# Patient Record
Sex: Female | Born: 1975 | Race: Black or African American | Hispanic: No | Marital: Single | Smoking: Current every day smoker
Health system: Southern US, Community
[De-identification: ages and names within clinical notes are randomized; demographics above are authoritative.]

## PROBLEM LIST (undated history)

## (undated) DIAGNOSIS — M5136 Other intervertebral disc degeneration, lumbar region: Secondary | ICD-10-CM

## (undated) DIAGNOSIS — M51369 Other intervertebral disc degeneration, lumbar region without mention of lumbar back pain or lower extremity pain: Secondary | ICD-10-CM

## (undated) DIAGNOSIS — IMO0002 Reserved for concepts with insufficient information to code with codable children: Secondary | ICD-10-CM

## (undated) DIAGNOSIS — I2699 Other pulmonary embolism without acute cor pulmonale: Secondary | ICD-10-CM

## (undated) DIAGNOSIS — I4891 Unspecified atrial fibrillation: Secondary | ICD-10-CM

## (undated) DIAGNOSIS — I82409 Acute embolism and thrombosis of unspecified deep veins of unspecified lower extremity: Secondary | ICD-10-CM

## (undated) DIAGNOSIS — M329 Systemic lupus erythematosus, unspecified: Secondary | ICD-10-CM

## (undated) DIAGNOSIS — G459 Transient cerebral ischemic attack, unspecified: Secondary | ICD-10-CM

## (undated) HISTORY — PX: LAPAROSCOPY: SHX197

## (undated) HISTORY — PX: GASTRIC BYPASS: SHX52

---

## 2015-01-06 ENCOUNTER — Encounter (HOSPITAL_COMMUNITY): Payer: Self-pay | Admitting: *Deleted

## 2015-01-06 ENCOUNTER — Emergency Department (HOSPITAL_COMMUNITY)
Admission: EM | Admit: 2015-01-06 | Discharge: 2015-01-06 | Disposition: A | Discharge status: Home / Self Care | Payer: Medicare Other | Attending: Emergency Medicine | Admitting: Emergency Medicine

## 2015-01-06 DIAGNOSIS — Z8679 Personal history of other diseases of the circulatory system: Secondary | ICD-10-CM | POA: Insufficient documentation

## 2015-01-06 DIAGNOSIS — M791 Myalgia, unspecified site: Secondary | ICD-10-CM

## 2015-01-06 DIAGNOSIS — R42 Dizziness and giddiness: Secondary | ICD-10-CM | POA: Diagnosis not present

## 2015-01-06 DIAGNOSIS — Z791 Long term (current) use of non-steroidal anti-inflammatories (NSAID): Secondary | ICD-10-CM | POA: Insufficient documentation

## 2015-01-06 DIAGNOSIS — Z79899 Other long term (current) drug therapy: Secondary | ICD-10-CM | POA: Diagnosis not present

## 2015-01-06 DIAGNOSIS — Z720 Tobacco use: Secondary | ICD-10-CM | POA: Diagnosis not present

## 2015-01-06 DIAGNOSIS — Z86718 Personal history of other venous thrombosis and embolism: Secondary | ICD-10-CM | POA: Diagnosis not present

## 2015-01-06 DIAGNOSIS — Z7982 Long term (current) use of aspirin: Secondary | ICD-10-CM | POA: Insufficient documentation

## 2015-01-06 DIAGNOSIS — M79606 Pain in leg, unspecified: Secondary | ICD-10-CM

## 2015-01-06 DIAGNOSIS — Z8673 Personal history of transient ischemic attack (TIA), and cerebral infarction without residual deficits: Secondary | ICD-10-CM | POA: Diagnosis not present

## 2015-01-06 DIAGNOSIS — Z86711 Personal history of pulmonary embolism: Secondary | ICD-10-CM | POA: Diagnosis not present

## 2015-01-06 DIAGNOSIS — Z87898 Personal history of other specified conditions: Secondary | ICD-10-CM

## 2015-01-06 HISTORY — DX: Other pulmonary embolism without acute cor pulmonale: I26.99

## 2015-01-06 HISTORY — DX: Reserved for concepts with insufficient information to code with codable children: IMO0002

## 2015-01-06 HISTORY — DX: Unspecified atrial fibrillation: I48.91

## 2015-01-06 HISTORY — DX: Acute embolism and thrombosis of unspecified deep veins of unspecified lower extremity: I82.409

## 2015-01-06 HISTORY — DX: Systemic lupus erythematosus, unspecified: M32.9

## 2015-01-06 HISTORY — DX: Transient cerebral ischemic attack, unspecified: G45.9

## 2015-01-06 LAB — BASIC METABOLIC PANEL
ANION GAP: 6 (ref 5–15)
BUN: 13 mg/dL (ref 6–23)
CO2: 29 mmol/L (ref 19–32)
Calcium: 9.1 mg/dL (ref 8.4–10.5)
Chloride: 106 mmol/L (ref 96–112)
Creatinine, Ser: 0.72 mg/dL (ref 0.50–1.10)
GFR calc Af Amer: >90 mL/min (ref >90)
Glucose, Bld: 77 mg/dL (ref 70–99)
Potassium: 3.7 mmol/L (ref 3.5–5.1)
Sodium: 141 mmol/L (ref 135–145)

## 2015-01-06 LAB — CBC WITH DIFFERENTIAL/PLATELET
Basophils Absolute: 0 10*3/uL (ref 0.0–0.1)
Basophils Relative: 1 % (ref 0–1)
Eosinophils Absolute: 0.1 10*3/uL (ref 0.0–0.7)
Eosinophils Relative: 2 % (ref 0–5)
HCT: 35 % — ABNORMAL LOW (ref 36.0–46.0)
Hemoglobin: 11.4 g/dL — ABNORMAL LOW (ref 12.0–15.0)
LYMPHS ABS: 1.1 10*3/uL (ref 0.7–4.0)
LYMPHS PCT: 33 % (ref 12–46)
MCH: 30.1 pg (ref 26.0–34.0)
MCHC: 32.6 g/dL (ref 30.0–36.0)
MCV: 92.3 fL (ref 78.0–100.0)
MONOS PCT: 12 % (ref 3–12)
Monocytes Absolute: 0.4 10*3/uL (ref 0.1–1.0)
Neutro Abs: 1.8 10*3/uL (ref 1.7–7.7)
Neutrophils Relative %: 52 % (ref 43–77)
Platelets: 275 10*3/uL (ref 150–400)
RBC: 3.79 MIL/uL — ABNORMAL LOW (ref 3.87–5.11)
RDW: 13.1 % (ref 11.5–15.5)
WBC: 3.4 10*3/uL — AB (ref 4.0–10.5)

## 2015-01-06 LAB — CK: CK TOTAL: 159 U/L (ref 7–177)

## 2015-01-06 MED ORDER — MECLIZINE HCL 25 MG PO TABS
12.5000 mg | ORAL_TABLET | Freq: Once | ORAL | Status: AC
  Administered 2015-01-06: 12.5 mg via ORAL
  Filled 2015-01-06: qty 1

## 2015-01-06 MED ORDER — CYCLOBENZAPRINE HCL 10 MG PO TABS: 10.0000 mg | ORAL_TABLET | Freq: Three times a day (TID) | ORAL | Status: DC | PRN

## 2015-01-06 MED ORDER — MORPHINE SULFATE 4 MG/ML IJ SOLN
4.0000 mg | Freq: Once | INTRAMUSCULAR | Status: AC
  Administered 2015-01-06: 4 mg via INTRAVENOUS
  Filled 2015-01-06: qty 1

## 2015-01-06 MED ORDER — OXYCODONE-ACETAMINOPHEN 5-325 MG PO TABS: 1.0000 | ORAL_TABLET | Freq: Four times a day (QID) | ORAL | Status: DC | PRN

## 2015-01-06 MED ORDER — NAPROXEN 500 MG PO TABS: 500.0000 mg | ORAL_TABLET | Freq: Two times a day (BID) | ORAL | Status: DC | PRN

## 2015-01-06 MED ORDER — SODIUM CHLORIDE 0.9 % IV BOLUS (SEPSIS)
1000.0000 mL | Freq: Once | INTRAVENOUS | Status: AC
  Administered 2015-01-06: 1000 mL via INTRAVENOUS

## 2015-01-06 NOTE — ED Notes (Signed)
Per EMS, pt complains of sharp left leg pain since yesterday. Pt has hx of lupus, a fib, PE, DVTs. Pt states her right leg also feels swollen.

## 2015-01-06 NOTE — Discharge Instructions (Signed)
Stay well hydrated. Take naprosyn as directed for inflammation and pain with percocet for breakthrough pain and flexeril for muscle relaxation. Do not drive or operate machinery with pain medication or muscle relaxation use. Apply heat to affected areas, no more than 20 minutes at a time every hour. Follow up with primary care physician for recheck of ongoing symptoms in 1 week or less. Return to ER for emergent changing or worsening of symptoms.     Musculoskeletal Pain Musculoskeletal pain is muscle and boney aches and pains. These pains can occur in any part of the body. Your caregiver may treat you without knowing the cause of the pain. They may treat you if blood or urine tests, X-rays, and other tests were normal.  CAUSES There is often not a definite cause or reason for these pains. These pains may be caused by a type of germ (virus). The discomfort may also come from overuse. Overuse includes working out too hard when your body is not fit. Boney aches also come from weather changes. Bone is sensitive to atmospheric pressure changes. HOME CARE INSTRUCTIONS   Ask when your test results will be ready. Make sure you get your test results.  Only take over-the-counter or prescription medicines for pain, discomfort, or fever as directed by your caregiver. If you were given medications for your condition, do not drive, operate machinery or power tools, or sign legal documents for 24 hours. Do not drink alcohol. Do not take sleeping pills or other medications that may interfere with treatment.  Continue all activities unless the activities cause more pain. When the pain lessens, slowly resume normal activities. Gradually increase the intensity and duration of the activities or exercise.  During periods of severe pain, bed rest may be helpful. Lay or sit in any position that is comfortable.  Putting ice on the injured area.  Put ice in a bag.  Place a towel between your skin and the bag.  Leave  the ice on for 15 to 20 minutes, 3 to 4 times a day.  Follow up with your caregiver for continued problems and no reason can be found for the pain. If the pain becomes worse or does not go away, it may be necessary to repeat tests or do additional testing. Your caregiver may need to look further for a possible cause. SEEK IMMEDIATE MEDICAL CARE IF:  You have pain that is getting worse and is not relieved by medications.  You develop chest pain that is associated with shortness or breath, sweating, feeling sick to your stomach (nauseous), or throw up (vomit).  Your pain becomes localized to the abdomen.  You develop any new symptoms that seem different or that concern you. MAKE SURE YOU:   Understand these instructions.  Will watch your condition.  Will get help right away if you are not doing well or get worse. Document Released: 10/16/2005 Document Revised: 01/08/2012 Document Reviewed: 06/20/2013 [facility] Patient Information 2015 [place], [place]. This information is not intended to replace advice given to you by your health care provider. Make sure you discuss any questions you have with your health care provider.  Heat Therapy Heat therapy can help ease sore, stiff, injured, and tight muscles and joints. Heat relaxes your muscles, which may help ease your pain.  RISKS AND COMPLICATIONS If you have any of the following conditions, do not use heat therapy unless your health care provider has approved:  Poor circulation.  Healing wounds or scarred skin in the area being treated.  Diabetes, heart disease, or high blood pressure.  Not being able to feel (numbness) the area being treated.  Unusual swelling of the area being treated.  Active infections.  Blood clots.  Cancer.  Inability to communicate pain. This may include young children and people who have problems with their brain function (dementia).  Pregnancy. Heat therapy should only be used on old, pre-existing, or  long-lasting (chronic) injuries. Do not use heat therapy on new injuries unless directed by your health care provider. HOW TO USE HEAT THERAPY There are several different kinds of heat therapy, including:  Moist heat pack.  Warm water bath.  Hot water bottle.  Electric heating pad.  Heated gel pack.  Heated wrap.  Electric heating pad. Use the heat therapy method suggested by your health care provider. Follow your health care provider's instructions on when and how to use heat therapy. GENERAL HEAT THERAPY RECOMMENDATIONS  Do not sleep while using heat therapy. Only use heat therapy while you are awake.  Your skin may turn pink while using heat therapy. Do not use heat therapy if your skin turns red.  Do not use heat therapy if you have new pain.  High heat or long exposure to heat can cause burns. Be careful when using heat therapy to avoid burning your skin.  Do not use heat therapy on areas of your skin that are already irritated, such as with a rash or sunburn. SEEK MEDICAL CARE IF:  You have blisters, redness, swelling, or numbness.  You have new pain.  Your pain is worse. MAKE SURE YOU:  Understand these instructions.  Will watch your condition.  Will get help right away if you are not doing well or get worse. Document Released: 01/08/2012 Document Revised: 03/02/2014 Document Reviewed: 12/09/2013 [facility] Patient Information 2015 [place], [place]. This information is not intended to replace advice given to you by your health care provider. Make sure you discuss any questions you have with your health care provider.

## 2015-01-06 NOTE — ED Notes (Signed)
Pt states she has a squeezing pain that starts in her thigh that radiates down to her foot.

## 2015-01-06 NOTE — ED Provider Notes (Signed)
CSN: [id number]     Arrival date & time 01/06/15  1129 History   First MD Initiated Contact with Patient 01/06/15 1304     Chief Complaint  Patient presents with  . Leg Pain     (Consider location/radiation/quality/duration/timing/severity/associated sxs/prior Treatment) HPI Comments: Caitlin Houston is a 39 y.o. female with a PMHx of lupus, afib, DVT/PE on xarelto, and TIA, with PSHx of gastric bypass, who presents to the ED with complaints of bilateral thigh pain that began gradually yesterday after recently being very active and on her feet for the last 1 week. She reports the pain is 9/10 squeezing and shooting pain, intermittent, located in the thigh radiating down to her feet, with no known aggravating factors, and no treatments tried prior to arrival. She states that she has had similar pain in the past when she "overdid it", and typically takes Percocet at home but she does not have this medication at this time due to running out. Additional symptoms include intermittent tingling in both legs, and subjective bilateral lower extremity swelling which is chronic and unchanged. She also reports that she has chronic dizziness for which she takes meclizine at home, but she did not take this today. She denies any fevers, chills, chest pain, shortness of breath, abdominal pain, nausea, vomiting, diarrhea, constipation, dysuria, hematuria, vaginal bleeding or discharge, melena, hematochezia, headache, vision changes, lightheadedness, numbness, or focal weakness. Denies any joint swelling, arthralgias, erythema, or warmth to bilateral lower extremities.  Patient is a 39 y.o. female presenting with leg pain. The history is provided by the patient. No language interpreter was used.  Leg Pain Location:  Leg Time since incident:  1 day Injury: no   Leg location:  L leg and R leg Pain details:    Quality: squeezing.   Radiates to:  Does not radiate   Severity:  Moderate   Onset quality:  Gradual    Duration:  1 day   Timing:  Intermittent   Progression:  Waxing and waning Chronicity:  Recurrent Prior injury to area:  No Relieved by:  None tried Worsened by:  Nothing tried Ineffective treatments:  None tried Associated symptoms: tingling (intermittently)   Associated symptoms: no back pain, no decreased ROM, no fever, no muscle weakness, no neck pain, no numbness, no stiffness and no swelling (subjective chronic unchanged BLE swelling)   Risk factors: obesity     Past Medical History  Diagnosis Date  . Lupus   . Pulmonary embolism   . DVT (deep venous thrombosis)   . Atrial fibrillation   . TIA (transient ischemic attack)    Past Surgical History  Procedure Laterality Date  . Gastric bypass     No family history on file. History  Substance Use Topics  . Smoking status: Current Every Day Smoker    Types: Cigarettes  . Smokeless tobacco: Not on file  . Alcohol Use: No   OB History    No data available     Review of Systems  Constitutional: Negative for fever and chills.  Eyes: Negative for visual disturbance.  Respiratory: Negative for shortness of breath.   Cardiovascular: Positive for leg swelling (subjective chronic BLE swelling, unchanged). Negative for chest pain and palpitations.  Gastrointestinal: Negative for nausea, vomiting, abdominal pain, diarrhea and constipation.  Genitourinary: Negative for dysuria, hematuria, vaginal bleeding and vaginal discharge.  Musculoskeletal: Positive for myalgias (BLEs). Negative for back pain, joint swelling, arthralgias, stiffness, neck pain and neck stiffness.  Skin: Negative for color change and  rash.  Allergic/Immunologic: Positive for immunocompromised state (on plaquenil).  Neurological: Positive for dizziness (chronic per pt, hasn't taken daily meclizine). Negative for seizures, syncope, facial asymmetry, weakness, light-headedness, numbness and headaches.  Hematological: Bruises/bleeds easily (on xarelto).   Psychiatric/Behavioral: Negative for confusion.   10 Systems reviewed and are negative for acute change except as noted in the HPI.    Allergies  Review of patient's allergies indicates no known allergies.  Home Medications   Prior to Admission medications   Medication Sig Start Date End Date Taking? Authorizing Provider  aspirin 81 MG tablet Take 81 mg by mouth 3 (three) times daily.   Yes Historical Provider, MD  butalbital-acetaminophen-caffeine (FIORICET, ESGIC) 50-325-40 MG per tablet Take 1 tablet by mouth as needed for headache or migraine.  12/02/14  Yes Historical Provider, MD  clotrimazole (LOTRIMIN) 1 % external solution Apply 1 application topically at bedtime. 11/09/14  Yes Historical Provider, MD  cyclobenzaprine (FLEXERIL) 10 MG tablet Take 1 tablet by mouth at bedtime as needed for muscle spasms.  11/15/14  Yes Historical Provider, MD  hydroxychloroquine (PLAQUENIL) 200 MG tablet Take 200 mg by mouth 3 (three) times daily.   Yes Historical Provider, MD  meclizine (ANTIVERT) 12.5 MG tablet Take 1 tablet by mouth 2 (two) times daily. 12/02/14  Yes Historical Provider, MD  naproxen (NAPROSYN) 500 MG tablet Take 2 tablets by mouth every 6 (six) hours as needed for moderate pain.  11/15/14  Yes Historical Provider, MD  rivaroxaban (XARELTO) 20 MG TABS tablet Take 20 mg by mouth daily.   Yes Historical Provider, MD   BP 121/75 mmHg  Pulse 70  Temp(Src) 98.6 F (37 C) (Oral)  Resp 18  SpO2 99%  LMP 01/04/2015 Physical Exam  Constitutional: She is oriented to person, place, and time. Vital signs are normal. She appears well-developed and well-nourished.  Non-toxic appearance. No distress.  Afebrile, nontoxic, NAD, resting comfortably  HENT:  Head: Normocephalic and atraumatic.  Mouth/Throat: Oropharynx is clear and moist and mucous membranes are normal.  Eyes: Conjunctivae and EOM are normal. Pupils are equal, round, and reactive to light. Right eye exhibits no discharge. Left  eye exhibits no discharge.  PERRL, EOMI, no nystagmus  Neck: Normal range of motion. Neck supple.  No meningismus  Cardiovascular: Normal rate, regular rhythm, normal heart sounds and intact distal pulses.  Exam reveals no gallop and no friction rub.   No murmur heard. RRR, nl s1/s2, no m/r/g, distal pulses intact, no pedal edema   Pulmonary/Chest: Effort normal and breath sounds normal. No respiratory distress. She has no decreased breath sounds. She has no wheezes. She has no rhonchi. She has no rales.  CTAB in all lung fields, no w/r/r, no hypoxia or increased WOB, speaking in full sentences, SpO2 99% on RA   Abdominal: Soft. Normal appearance and bowel sounds are normal. She exhibits no distension. There is no tenderness. There is no rigidity, no rebound, no guarding, no CVA tenderness, no tenderness at McBurney's point and negative Murphy's sign.  Musculoskeletal: Normal range of motion.       Right upper leg: She exhibits tenderness. She exhibits no bony tenderness, no swelling, no edema and no deformity.       Left upper leg: She exhibits tenderness. She exhibits no bony tenderness, no swelling, no edema and no deformity.       Right lower leg: She exhibits tenderness. She exhibits no bony tenderness, no swelling, no edema and no deformity.  Left lower leg: She exhibits tenderness. She exhibits no bony tenderness, no swelling, no edema and no deformity.  Diffuse b/l thigh and lower leg TTP, no focal bony TTP, no swelling or edema, no deformities. FROM intact at all major joints. Strength 5/5 in all extremities, sensation grossly intact in all extremities, distal pulses intact. Neg homan's sign, although pt has discomfort with any palpation in lower extremities. No joint swelling or erythema, no warmth or skin changes.  Neurological: She is alert and oriented to person, place, and time. She has normal strength. No cranial nerve deficit or sensory deficit. GCS eye subscore is 4. GCS verbal  subscore is 5. GCS motor subscore is 6.  CN 2-12 grossly intact A&O x4 GCS 15 Sensation and strength intact  Skin: Skin is warm, dry and intact. No bruising and no rash noted. No erythema.  Psychiatric: She has a normal mood and affect.  Nursing note and vitals reviewed.   ED Course  Procedures (including critical care time) Labs Review Labs Reviewed  CBC WITH DIFFERENTIAL/PLATELET - Abnormal; Notable for the following:    WBC 3.4 (*)    RBC 3.79 (*)    Hemoglobin 11.4 (*)    HCT 35.0 (*)    All other components within normal limits  BASIC METABOLIC PANEL  CK    Imaging Review No results found.   EKG Interpretation None      MDM   Final diagnoses:  Pain of lower extremity, unspecified laterality  Muscle pain  H/O dizziness    39 y.o. female here with bilateral leg pain 1 day after recently being very active and on her feet 1 week. She states this is happened before when she has been very active. Compliant on all medications including Xarelto, no pedal edema or Homans sign, no hypoxia or tachycardia, no CP/SOB complaints, no concern for DVT/PE causing her pain. Patient chronically dizzy, did not take meclizine today, will give this medication here. Nonfocal neuro exam, no neurologic symptoms aside from chronic dizziness, doubt TIA/CVA or acute intracranial etiology, doubt need for imaging. Extremities neurovascularly intact with soft compartments. Given her history of recent exertion and history of lupus, will obtain basic labs and CK level. Will give fluids. We'll also give small doses of morphine for pain. Patient takes Percocet at home but has run out. I believe it is reasonable to give her a refill. Will reassess shortly.  3:19 PM Pt resting, feeling improved. CBC w/ chronic anemia noted. BMP and CK WNL. Like just musculoskeletal pain from exertion. Pt requesting refill of naprosyn x30 day supply, also requesting refill of flexeril and percocet. Agreed to giving few  days of flexeril/percocet but discussed that chronic controlled substance prescriptions will need to be filled by her PCP. Will have her f/up with PCP in 1wk for recheck. Discussed good hydration, ice/heat, and avoiding overexertion. I explained the diagnosis and have given explicit precautions to return to the ER including for any other new or worsening symptoms. The patient understands and accepts the medical plan as it's been dictated and I have answered their questions. Discharge instructions concerning home care and prescriptions have been given. The patient is STABLE and is discharged to home in good condition.  BP 121/75 mmHg  Pulse 70  Temp(Src) 98.6 F (37 C) (Oral)  Resp 18  SpO2 99%  LMP 01/04/2015  Meds ordered this encounter  Medications  . meclizine (ANTIVERT) tablet 12.5 mg    Sig:   . sodium chloride 0.9 %  bolus 1,000 mL    Sig:   . morphine 4 MG/ML injection 4 mg    Sig:   . cyclobenzaprine (FLEXERIL) 10 MG tablet    Sig: Take 1 tablet (10 mg total) by mouth 3 (three) times daily as needed for muscle spasms.    Dispense:  15 tablet    Refill:  0    Order Specific Question:  Supervising Provider    Answer:  MILLER, BRIAN [[id number]]  . oxyCODONE-acetaminophen (PERCOCET) 5-325 MG per tablet    Sig: Take 1 tablet by mouth every 6 (six) hours as needed for severe pain.    Dispense:  12 tablet    Refill:  0    Order Specific Question:  Supervising Provider    Answer:  MILLER, BRIAN [[id number]]  . naproxen (NAPROSYN) 500 MG tablet    Sig: Take 1 tablet (500 mg total) by mouth 2 (two) times daily as needed for mild pain, moderate pain or headache (TAKE WITH MEALS.).    Dispense:  60 tablet    Refill:  0    Order Specific Question:  Supervising Provider    Answer:  Noemi Chapel [[id number]]     Eevee Borbon Camprubi-Soms, PA-C 01/06/15 [place], MD 01/06/15 1943

## 2015-03-22 ENCOUNTER — Emergency Department (HOSPITAL_COMMUNITY)
Admission: EM | Admit: 2015-03-22 | Discharge: 2015-03-22 | Disposition: A | Discharge status: Home / Self Care | Payer: Medicare Other | Attending: Emergency Medicine | Admitting: Emergency Medicine

## 2015-03-22 ENCOUNTER — Encounter (HOSPITAL_COMMUNITY): Payer: Self-pay

## 2015-03-22 DIAGNOSIS — Z86718 Personal history of other venous thrombosis and embolism: Secondary | ICD-10-CM | POA: Diagnosis not present

## 2015-03-22 DIAGNOSIS — Z8679 Personal history of other diseases of the circulatory system: Secondary | ICD-10-CM | POA: Diagnosis not present

## 2015-03-22 DIAGNOSIS — Z8739 Personal history of other diseases of the musculoskeletal system and connective tissue: Secondary | ICD-10-CM | POA: Insufficient documentation

## 2015-03-22 DIAGNOSIS — Z791 Long term (current) use of non-steroidal anti-inflammatories (NSAID): Secondary | ICD-10-CM | POA: Insufficient documentation

## 2015-03-22 DIAGNOSIS — Z86711 Personal history of pulmonary embolism: Secondary | ICD-10-CM | POA: Insufficient documentation

## 2015-03-22 DIAGNOSIS — Z7982 Long term (current) use of aspirin: Secondary | ICD-10-CM | POA: Diagnosis not present

## 2015-03-22 DIAGNOSIS — Z760 Encounter for issue of repeat prescription: Secondary | ICD-10-CM | POA: Insufficient documentation

## 2015-03-22 DIAGNOSIS — Z79899 Other long term (current) drug therapy: Secondary | ICD-10-CM | POA: Diagnosis not present

## 2015-03-22 DIAGNOSIS — Z8673 Personal history of transient ischemic attack (TIA), and cerebral infarction without residual deficits: Secondary | ICD-10-CM | POA: Diagnosis not present

## 2015-03-22 DIAGNOSIS — Z720 Tobacco use: Secondary | ICD-10-CM | POA: Insufficient documentation

## 2015-03-22 MED ORDER — RIVAROXABAN 20 MG PO TABS: 20.0000 mg | ORAL_TABLET | Freq: Every day | ORAL | Status: DC

## 2015-03-22 MED ORDER — HYDROXYCHLOROQUINE SULFATE 200 MG PO TABS: 200.0000 mg | ORAL_TABLET | Freq: Three times a day (TID) | ORAL | Status: DC

## 2015-03-22 MED ORDER — CYCLOBENZAPRINE HCL 10 MG PO TABS: 10.0000 mg | ORAL_TABLET | Freq: Three times a day (TID) | ORAL | Status: DC | PRN

## 2015-03-22 MED ORDER — BUTALBITAL-APAP-CAFFEINE 50-325-40 MG PO TABS: 1.0000 | ORAL_TABLET | Freq: Four times a day (QID) | ORAL | Status: DC | PRN

## 2015-03-22 MED ORDER — OXYCODONE-ACETAMINOPHEN 5-325 MG PO TABS: 1.0000 | ORAL_TABLET | Freq: Four times a day (QID) | ORAL | Status: DC | PRN

## 2015-03-22 NOTE — ED Notes (Signed)
Pt here for a medication refill on on her meds. She's from [place] and has all her insurance information transferred down here but she can't find any Dr to see her for atleast one to three months. One of the doctors told her to come to the ER to get medications until she can get an appointment.

## 2015-03-22 NOTE — ED Provider Notes (Signed)
CSN: [id number]     Arrival date & time 03/22/15  1957 History   None    This chart was scribed for non-physician practitioner, Junius Creamer, [place] working with Serita Grit, MD by Forrestine Him, ED Scribe. This patient was seen in room WTR6/WTR6 and the patient's care was started at 9:30 PM.   Chief Complaint  Patient presents with  . Medication Refill   The history is provided by the patient. No language interpreter was used.    HPI Comments: Caitlin Houston is a 39 y.o. female with a PMHx of lupus, DVT, TIA, PE, and A-Fib who presents to the Emergency Department here for a medication refill this evening. Pt states she is orginally from [place] and had all medical insurance information transferred to [facility]. However, pt states she is unable to establish with a care team for another 1-3 months. [name] states she was advised to come to the emergency department until she can be seen for her appointment. No known allergies to medications.  Appointment with [facility] scheduled in 6 months  Past Medical History  Diagnosis Date  . Lupus   . Pulmonary embolism   . DVT (deep venous thrombosis)   . Atrial fibrillation   . TIA (transient ischemic attack)    Past Surgical History  Procedure Laterality Date  . Gastric bypass     History reviewed. No pertinent family history. History  Substance Use Topics  . Smoking status: Current Every Day Smoker    Types: Cigarettes  . Smokeless tobacco: Not on file  . Alcohol Use: No   OB History    No data available     Review of Systems  Constitutional: Negative for fever and chills.  Respiratory: Negative for shortness of breath.   Cardiovascular: Negative for chest pain.  Musculoskeletal: Positive for arthralgias.  Skin: Negative for rash and wound.  Neurological: Negative for dizziness and headaches.      Allergies  Review of patient's allergies indicates no known allergies.  Home Medications   Prior to  Admission medications   Medication Sig Start Date End Date Taking? Authorizing Provider  aspirin 81 MG tablet Take 81 mg by mouth 3 (three) times daily.    Historical Provider, MD  butalbital-acetaminophen-caffeine (FIORICET, ESGIC) 50-325-40 MG per tablet Take 1 tablet by mouth every 6 (six) hours as needed for headache or migraine. 03/22/15   Junius Creamer, NP  clotrimazole (LOTRIMIN) 1 % external solution Apply 1 application topically at bedtime. 11/09/14   Historical Provider, MD  cyclobenzaprine (FLEXERIL) 10 MG tablet Take 1 tablet (10 mg total) by mouth 3 (three) times daily as needed for muscle spasms. 03/22/15   Junius Creamer, NP  hydroxychloroquine (PLAQUENIL) 200 MG tablet Take 1 tablet (200 mg total) by mouth 3 (three) times daily. 03/22/15   Junius Creamer, NP  meclizine (ANTIVERT) 12.5 MG tablet Take 1 tablet by mouth 2 (two) times daily. 12/02/14   Historical Provider, MD  naproxen (NAPROSYN) 500 MG tablet Take 2 tablets by mouth every 6 (six) hours as needed for moderate pain.  11/15/14   Historical Provider, MD  naproxen (NAPROSYN) 500 MG tablet Take 1 tablet (500 mg total) by mouth 2 (two) times daily as needed for mild pain, moderate pain or headache (TAKE WITH MEALS.). 01/06/15   Mercedes Camprubi-Soms, PA-C  oxyCODONE-acetaminophen (PERCOCET) 5-325 MG per tablet Take 1 tablet by mouth every 6 (six) hours as needed for severe pain. 03/22/15   Junius Creamer, NP  rivaroxaban (XARELTO) 20 MG TABS tablet Take 1 tablet (20 mg total) by mouth daily. 03/22/15   Junius Creamer, NP   Triage Vitals: BP 119/74 mmHg  Pulse 98  Temp(Src) 98.7 F (37.1 C) (Oral)  Resp 20  SpO2 98%   Physical Exam  Constitutional: She is oriented to person, place, and time. She appears well-developed and well-nourished.  HENT:  Head: Normocephalic.  Eyes: EOM are normal.  Neck: Normal range of motion.  Pulmonary/Chest: Effort normal.  Musculoskeletal: Normal range of motion.  Neurological: She is alert and oriented to  person, place, and time.  Psychiatric: She has a normal mood and affect.  Nursing note and vitals reviewed.   ED Course  Procedures (including critical care time)  DIAGNOSTIC STUDIES: Oxygen Saturation is 98% on RA, Normal by my interpretation.    COORDINATION OF CARE: 9:31 PM- Will provide a referral for pt to be seen at [facility]. Will provide pt with a 1 month supply of needed medications. Discussed treatment plan with pt at bedside and pt agreed to plan.     Labs Review Labs Reviewed - No data to display  Imaging Review No results found.   EKG Interpretation None     Patient has been referred to [facility].  Hopefully, this will reduce the gap while she is waiting for an appointment [facility], which will be in 3-6 months is been given a one-month prescription of her much-needed medications including furosemide, Flexeril, Plaquenil, Percocet.   MDM   Final diagnoses:  Encounter for medication refill    I personally performed the services described in this documentation, which was scribed in my presence. The recorded information has been reviewed and is accurate.   Junius Creamer, NP 03/22/15 2153  Junius Creamer, NP 03/23/15 2009  Serita Grit, MD 03/26/15 [phone]

## 2015-03-22 NOTE — Discharge Instructions (Signed)
Medication Refill, Emergency Department We have refilled your medication today as a courtesy to you. It is best for your medical care, however, to take care of getting refills done through your primary caregiver's office. They have your records and can do a better job of follow-up than we can in the emergency department. On maintenance medications, we often only prescribe enough medications to get you by until you are able to see your regular caregiver. This is a more expensive way to refill medications. In the future, please plan for refills so that you will not have to use the emergency department for this. Thank you for your help. Your help allows [place] to better take care of the daily emergencies that enter our department. Document Released: 02/02/2004 Document Revised: 01/08/2012 Document Reviewed: 01/23/2014 [facility] Patient Information 2015 [place], [place]. This information is not intended to replace advice given to you by your health care provider. Make sure you discuss any questions you have with your health care provider. Refill prescriptions in the emergency department are done on a very as needed basis You havebeen given referrals to make an appointment with primary care physician at [facility] This should bridge the gap while you're waiting for an appointment with the family practice center

## 2015-05-31 ENCOUNTER — Encounter (HOSPITAL_COMMUNITY): Payer: Self-pay | Admitting: Emergency Medicine

## 2015-05-31 ENCOUNTER — Emergency Department (HOSPITAL_COMMUNITY)
Admission: EM | Admit: 2015-05-31 | Discharge: 2015-05-31 | Disposition: A | Discharge status: Home / Self Care | Payer: Medicare Other | Attending: Emergency Medicine | Admitting: Emergency Medicine

## 2015-05-31 DIAGNOSIS — Z8739 Personal history of other diseases of the musculoskeletal system and connective tissue: Secondary | ICD-10-CM | POA: Diagnosis not present

## 2015-05-31 DIAGNOSIS — Z760 Encounter for issue of repeat prescription: Secondary | ICD-10-CM | POA: Diagnosis present

## 2015-05-31 DIAGNOSIS — Z86718 Personal history of other venous thrombosis and embolism: Secondary | ICD-10-CM | POA: Insufficient documentation

## 2015-05-31 DIAGNOSIS — Z9884 Bariatric surgery status: Secondary | ICD-10-CM | POA: Diagnosis not present

## 2015-05-31 DIAGNOSIS — Z79899 Other long term (current) drug therapy: Secondary | ICD-10-CM | POA: Insufficient documentation

## 2015-05-31 DIAGNOSIS — Z86711 Personal history of pulmonary embolism: Secondary | ICD-10-CM | POA: Diagnosis not present

## 2015-05-31 DIAGNOSIS — Z720 Tobacco use: Secondary | ICD-10-CM | POA: Insufficient documentation

## 2015-05-31 DIAGNOSIS — I4891 Unspecified atrial fibrillation: Secondary | ICD-10-CM | POA: Insufficient documentation

## 2015-05-31 DIAGNOSIS — Z7982 Long term (current) use of aspirin: Secondary | ICD-10-CM | POA: Insufficient documentation

## 2015-05-31 DIAGNOSIS — Z8673 Personal history of transient ischemic attack (TIA), and cerebral infarction without residual deficits: Secondary | ICD-10-CM | POA: Diagnosis not present

## 2015-05-31 DIAGNOSIS — Z7901 Long term (current) use of anticoagulants: Secondary | ICD-10-CM | POA: Diagnosis not present

## 2015-05-31 MED ORDER — HYDROXYCHLOROQUINE SULFATE 200 MG PO TABS: 200.0000 mg | ORAL_TABLET | Freq: Three times a day (TID) | ORAL | Status: AC

## 2015-05-31 MED ORDER — MECLIZINE HCL 12.5 MG PO TABS: 12.5000 mg | ORAL_TABLET | Freq: Two times a day (BID) | ORAL | Status: AC

## 2015-05-31 MED ORDER — ASPIRIN 81 MG PO TABS: 81.0000 mg | ORAL_TABLET | Freq: Three times a day (TID) | ORAL | Status: AC

## 2015-05-31 MED ORDER — RIVAROXABAN 20 MG PO TABS: 20.0000 mg | ORAL_TABLET | Freq: Every day | ORAL | Status: AC

## 2015-05-31 MED ORDER — CLOTRIMAZOLE 1 % EX SOLN: 1.0000 "application " | Freq: Every day | CUTANEOUS | Status: AC

## 2015-05-31 MED ORDER — BUTALBITAL-APAP-CAFFEINE 50-325-40 MG PO TABS: 1.0000 | ORAL_TABLET | Freq: Four times a day (QID) | ORAL | Status: AC | PRN

## 2015-05-31 MED ORDER — CYCLOBENZAPRINE HCL 10 MG PO TABS: 10.0000 mg | ORAL_TABLET | Freq: Three times a day (TID) | ORAL | Status: DC | PRN

## 2015-05-31 MED ORDER — NAPROXEN 500 MG PO TABS: 1000.0000 mg | ORAL_TABLET | Freq: Four times a day (QID) | ORAL | Status: DC | PRN

## 2015-05-31 NOTE — Progress Notes (Signed)
Pt with 3 [facility] ED visits. Cm contacted by ED PA/NP, Rob B. Pt in to get refill on medications for last few ED visits Cm reviewed epic and spoke with pt who states she has attempted to find a new pcp but the earliest appt was found in [place] at the end of august Pt confirms she has medicare and [place] medicaid and came to [employer] in February 2016 from [place]. Pt states she called lists of medicaid doctors for [place], [place],  and [place] but all with appts a month or more out. Pt states her pcp in [place] had set her up for a pain management referral but she relocated before the appt.  Cm discussed the differences between EDPs and pcps and the need for her to secure a pcp to assist with monthly medication refills. Pt voiced understanding  Pt unable to tell Cm who her appt is with and where the appt in August 2016 is located "states it is at home in a purple folder Cm provided pt with a list of [employer] and a 14 page list of medicare providers within zip code [phone] Pt began calling doctors from the medicaid list Pt states pending call back from [facility]orene Dy for an earlier appt vs going to [employer]. CM present when pt was informed she should receive a call back by tomorrow. CM emphasized the websites for www.medicare.gov and [place] medicaid plus  Pt happy and expressed appreciation of resources offered

## 2015-05-31 NOTE — ED Provider Notes (Signed)
CSN: [id number]     Arrival date & time 05/31/15  1215 History  This chart was scribed for non-physician practitioner, Montine Circle, PA-C, working with Malvin Johns, MD by Ladene Artist, ED Scribe. This patient was seen in room [place] and the patient's care was started at 12:46 PM.   Chief Complaint  Patient presents with  . Medication Refill   The history is provided by the patient. No language interpreter was used.   HPI Comments: Caitlin Houston is a 39 y.o. female, with a h/o lupus, multiple DVTs and PE, who presents to the Emergency Department for medication refill. Pt recently moved from [place] and is transitioning to a new PCP. She has an upcoming appointment at the end of the month but ran out of Flexeril, Meclizine, Xarelto and Percocet 2 weeks ago. She requests a refill for the medications listed at this time.   Past Medical History  Diagnosis Date  . Lupus   . Pulmonary embolism   . DVT (deep venous thrombosis)   . Atrial fibrillation   . TIA (transient ischemic attack)    Past Surgical History  Procedure Laterality Date  . Gastric bypass     No family history on file. History  Substance Use Topics  . Smoking status: Current Every Day Smoker    Types: Cigarettes  . Smokeless tobacco: Not on file  . Alcohol Use: No   OB History    No data available     Review of Systems  Constitutional: Negative for fever.   Allergies  Review of patient's allergies indicates no known allergies.  Home Medications   Prior to Admission medications   Medication Sig Start Date End Date Taking? Authorizing Provider  aspirin 81 MG tablet Take 81 mg by mouth 3 (three) times daily.    Historical Provider, MD  butalbital-acetaminophen-caffeine (FIORICET, ESGIC) 50-325-40 MG per tablet Take 1 tablet by mouth every 6 (six) hours as needed for headache or migraine. 03/22/15   Junius Creamer, NP  clotrimazole (LOTRIMIN) 1 % external solution Apply 1 application topically at bedtime.  11/09/14   Historical Provider, MD  cyclobenzaprine (FLEXERIL) 10 MG tablet Take 1 tablet (10 mg total) by mouth 3 (three) times daily as needed for muscle spasms. 03/22/15   Junius Creamer, NP  hydroxychloroquine (PLAQUENIL) 200 MG tablet Take 1 tablet (200 mg total) by mouth 3 (three) times daily. 03/22/15   Junius Creamer, NP  meclizine (ANTIVERT) 12.5 MG tablet Take 1 tablet by mouth 2 (two) times daily. 12/02/14   Historical Provider, MD  naproxen (NAPROSYN) 500 MG tablet Take 2 tablets by mouth every 6 (six) hours as needed for moderate pain.  11/15/14   Historical Provider, MD  naproxen (NAPROSYN) 500 MG tablet Take 1 tablet (500 mg total) by mouth 2 (two) times daily as needed for mild pain, moderate pain or headache (TAKE WITH MEALS.). 01/06/15   Mercedes Camprubi-Soms, PA-C  oxyCODONE-acetaminophen (PERCOCET) 5-325 MG per tablet Take 1 tablet by mouth every 6 (six) hours as needed for severe pain. 03/22/15   Junius Creamer, NP  rivaroxaban (XARELTO) 20 MG TABS tablet Take 1 tablet (20 mg total) by mouth daily. 03/22/15   Junius Creamer, NP   BP 142/84 mmHg  Pulse 80  Temp(Src) 99 F (37.2 C) (Oral)  Resp 19  SpO2 100% Physical Exam  Constitutional: She is oriented to person, place, and time. She appears well-developed and well-nourished. No distress.  HENT:  Head: Normocephalic and atraumatic.  Eyes: Conjunctivae and  EOM are normal.  Neck: Neck supple. No tracheal deviation present.  Cardiovascular: Normal rate.   Pulmonary/Chest: Effort normal. No respiratory distress.  Musculoskeletal: Normal range of motion.  Neurological: She is alert and oriented to person, place, and time.  Skin: Skin is warm and dry.  Psychiatric: She has a normal mood and affect. Her behavior is normal.  Nursing note and vitals reviewed.  ED Course  Procedures (including critical care time) DIAGNOSTIC STUDIES: Oxygen Saturation is 100% on RA, normal by my interpretation.    COORDINATION OF CARE: 12:50 PM-Discussed  treatment plan which includes follow-up with PCP with pt at bedside and pt agreed to plan.   Labs Review Labs Reviewed - No data to display  Imaging Review No results found.   EKG Interpretation None      MDM   Final diagnoses:  None    Patient has no complaints, just needs her prescriptions refilled. Will refill everything but Percocet.Recommend close follow-up. Spoke with case management about the patient, who will see the patient while she is in the ED.  I personally performed the services described in this documentation, which was scribed in my presence. The recorded information has been reviewed and is accurate.      Montine Circle, PA-C 05/31/15 [place], MD 05/31/15 [phone]

## 2015-05-31 NOTE — ED Notes (Addendum)
Pt states that she was seen here while back and got her medications filled. Pt states that she is out and starting having pain last night.  Wanting oxy, xeralto, flexeril, and meclizine and something else.

## 2015-05-31 NOTE — Discharge Instructions (Signed)
Medication Refill, Emergency Department °We have refilled your medication today as a courtesy to you. It is best for your medical care, however, to take care of getting refills done through your primary caregiver's office. They have your records and can do a better job of follow-up than we can in the emergency department. °On maintenance medications, we often only prescribe enough medications to get you by until you are able to see your regular caregiver. This is a more expensive way to refill medications. °In the future, please plan for refills so that you will not have to use the emergency department for this. °Thank you for your help. Your help allows us to better take care of the daily emergencies that enter our department. °Document Released: 02/02/2004 Document Revised: 01/08/2012 Document Reviewed: 01/23/2014 °[employer]® Patient Information ©2015 [employer]. This information is not intended to replace advice given to you by your health care provider. Make sure you discuss any questions you have with your health care provider. ° °

## 2015-06-05 ENCOUNTER — Encounter (HOSPITAL_COMMUNITY): Payer: Self-pay | Admitting: *Deleted

## 2015-06-05 ENCOUNTER — Emergency Department (HOSPITAL_COMMUNITY)
Admission: EM | Admit: 2015-06-05 | Discharge: 2015-06-05 | Disposition: A | Discharge status: Home / Self Care | Payer: Medicare Other | Attending: Emergency Medicine | Admitting: Emergency Medicine

## 2015-06-05 DIAGNOSIS — Z7982 Long term (current) use of aspirin: Secondary | ICD-10-CM | POA: Diagnosis not present

## 2015-06-05 DIAGNOSIS — M5416 Radiculopathy, lumbar region: Secondary | ICD-10-CM | POA: Diagnosis not present

## 2015-06-05 DIAGNOSIS — I4891 Unspecified atrial fibrillation: Secondary | ICD-10-CM | POA: Diagnosis not present

## 2015-06-05 DIAGNOSIS — Z8739 Personal history of other diseases of the musculoskeletal system and connective tissue: Secondary | ICD-10-CM | POA: Insufficient documentation

## 2015-06-05 DIAGNOSIS — G8929 Other chronic pain: Secondary | ICD-10-CM | POA: Diagnosis not present

## 2015-06-05 DIAGNOSIS — Z8673 Personal history of transient ischemic attack (TIA), and cerebral infarction without residual deficits: Secondary | ICD-10-CM | POA: Insufficient documentation

## 2015-06-05 DIAGNOSIS — Z86711 Personal history of pulmonary embolism: Secondary | ICD-10-CM | POA: Insufficient documentation

## 2015-06-05 DIAGNOSIS — Z79899 Other long term (current) drug therapy: Secondary | ICD-10-CM | POA: Insufficient documentation

## 2015-06-05 DIAGNOSIS — Z8639 Personal history of other endocrine, nutritional and metabolic disease: Secondary | ICD-10-CM | POA: Insufficient documentation

## 2015-06-05 DIAGNOSIS — Z86718 Personal history of other venous thrombosis and embolism: Secondary | ICD-10-CM | POA: Insufficient documentation

## 2015-06-05 DIAGNOSIS — R21 Rash and other nonspecific skin eruption: Secondary | ICD-10-CM | POA: Insufficient documentation

## 2015-06-05 DIAGNOSIS — Z7901 Long term (current) use of anticoagulants: Secondary | ICD-10-CM | POA: Insufficient documentation

## 2015-06-05 DIAGNOSIS — Z9884 Bariatric surgery status: Secondary | ICD-10-CM | POA: Insufficient documentation

## 2015-06-05 DIAGNOSIS — Z720 Tobacco use: Secondary | ICD-10-CM | POA: Insufficient documentation

## 2015-06-05 DIAGNOSIS — M545 Low back pain: Secondary | ICD-10-CM | POA: Diagnosis present

## 2015-06-05 MED ORDER — OXYCODONE-ACETAMINOPHEN 5-325 MG PO TABS: 1.0000 | ORAL_TABLET | ORAL | Status: DC | PRN

## 2015-06-05 MED ORDER — OXYCODONE-ACETAMINOPHEN 5-325 MG PO TABS
2.0000 | ORAL_TABLET | Freq: Once | ORAL | Status: AC
  Administered 2015-06-05: 2 via ORAL
  Filled 2015-06-05: qty 2

## 2015-06-05 NOTE — ED Provider Notes (Signed)
CSN: [id number]     Arrival date & time 06/05/15  1919 History  This chart was scribed for non-physician practitioner Domenic Moras, PA-C, working with Wandra Arthurs, MD, by Thea Alken, ED Scribe. This patient was seen in room WTR7/WTR7 and the patient's care was started at 8:08 PM.  Chief Complaint  Patient presents with  . Hip Pain   The history is provided by the patient. No language interpreter was used.    Caitlin Houston is a 39 y.o. female who presents to the Emergency Department complaining of hip pain. Pt states she was being treated in [place] for hx of 3 blood clots in right leg and lupus which she was diagnosed with at age 23.  Pt was seen here 5 days ago for a medication refill. Since last visit she has had gradually worsening right low back pain that radiates down to right hip and right leg. 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 She has pain with certain movement,y walking up and down stair and bearing weight to right leg. She took flexeril, which she was prescribe 5 days ago, last night without relief to the pain and reports waking up today in excruciating pain .  No fever, chills, bowel/bladder incontinence.  Has chronic lupus rash, no new rash.  No new injuries.  Currently on Xarelto for her DVT.    Past Medical History  Diagnosis Date  . Lupus   . Pulmonary embolism   . DVT (deep venous thrombosis)   . Atrial fibrillation   . TIA (transient ischemic attack)    Past Surgical History  Procedure Laterality Date  . Gastric bypass     No family history on file. History  Substance Use Topics  . Smoking status: Current Every Day Smoker    Types: Cigarettes  . Smokeless tobacco: Not on file  . Alcohol Use: No   OB History    No data available     Review of Systems  Musculoskeletal: Positive for myalgias, back pain, arthralgias and gait problem.  Skin: Positive for rash.   Allergies  Review of patient's allergies indicates no  known allergies.  Home Medications   Prior to Admission medications   Medication Sig Start Date End Date Taking? Authorizing Provider  aspirin 81 MG tablet Take 1 tablet (81 mg total) by mouth 3 (three) times daily. 05/31/15   Montine Circle, PA-C  butalbital-acetaminophen-caffeine (FIORICET, ESGIC) 250 188 9706 MG per tablet Take 1 tablet by mouth every 6 (six) hours as needed for headache or migraine. 05/31/15   Montine Circle, PA-C  clotrimazole (LOTRIMIN) 1 % external solution Apply 1 application topically at bedtime. 05/31/15   Montine Circle, PA-C  cyclobenzaprine (FLEXERIL) 10 MG tablet Take 1 tablet (10 mg total) by mouth 3 (three) times daily as needed for muscle spasms. 05/31/15   Montine Circle, PA-C  hydroxychloroquine (PLAQUENIL) 200 MG tablet Take 1 tablet (200 mg total) by mouth 3 (three) times daily. 05/31/15   Montine Circle, PA-C  meclizine (ANTIVERT) 12.5 MG tablet Take 1 tablet (12.5 mg total) by mouth 2 (two) times daily. 05/31/15   Montine Circle, PA-C  naproxen (NAPROSYN) 500 MG tablet Take 2 tablets (1,000 mg total) by mouth every 6 (six) hours as needed for moderate pain. 05/31/15   Montine Circle, PA-C  oxyCODONE-acetaminophen (PERCOCET) 5-325 MG per tablet Take 1 tablet by mouth every 6 (six) hours as needed for severe pain. 03/22/15   Junius Creamer,  NP  rivaroxaban (XARELTO) 20 MG TABS tablet Take 1 tablet (20 mg total) by mouth daily. 05/31/15   Montine Circle, PA-C   BP 120/83 mmHg  Pulse 76  Temp(Src) 97.8 F (36.6 C) (Oral)  Resp 20  SpO2 97% Physical Exam  Constitutional: She is oriented to person, place, and time. She appears well-developed and well-nourished. No distress.  Morbidly obese AAF, nontoxic.  HENT:  Head: Normocephalic and atraumatic.  Eyes: Conjunctivae and EOM are normal.  Neck: Neck supple.  Cardiovascular: Normal rate and intact distal pulses.   Pulmonary/Chest: Effort normal.  Musculoskeletal: Normal range of motion.  Right para spinal muscle TTP  and right sacral muscle. Able to ambulate with assistance favoring left leg.  Neurological: She is alert and oriented to person, place, and time.  Intact Patella DTR bilat.  No foot drops  Skin: Skin is warm and dry.  Psychiatric: She has a normal mood and affect. Her behavior is normal.  Nursing note and vitals reviewed.   ED Course  Procedures (including critical care time) DIAGNOSTIC STUDIES: Oxygen Saturation is 97% on RA, normal by my interpretation.    COORDINATION OF CARE: 8:21 PM- Pt advised of plan for treatment and pt agrees.  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 No new red flags. She was seen in ER previously for same. She has an appointment with her PCP in 2 days . Pt made aware we do not give narcotic prescription for chronic pain.  However I will provide a short course of pain medication to help to Monday. She is aware that she will not receive additional narcotic prescription from the ER for chronic pain.  Labs Review Labs Reviewed - No data to display  Imaging Review No results found.   EKG Interpretation None      MDM   Final diagnoses:  Chronic radicular pain of lower back    BP 120/83 mmHg  Pulse 76  Temp(Src) 97.8 F (36.6 C) (Oral)  Resp 20  SpO2 97%   I personally performed the services described in this documentation, which was scribed in my presence. The recorded information has been reviewed and is accurate.     Domenic Moras, PA-C 06/05/15 2031  Wandra Arthurs, MD 06/05/15 [phone]

## 2015-06-05 NOTE — Discharge Instructions (Signed)
Please follow up promptly with your PCP for further management of your condition.   Chronic Back Pain  When back pain lasts longer than 3 months, it is called chronic back pain.People with chronic back pain often go through certain periods that are more intense (flare-ups).  CAUSES Chronic back pain can be caused by wear and tear (degeneration) on different structures in your back. These structures include:  The bones of your spine (vertebrae) and the joints surrounding your spinal cord and nerve roots (facets).  The strong, fibrous tissues that connect your vertebrae (ligaments). Degeneration of these structures may result in pressure on your nerves. This can lead to constant pain. HOME CARE INSTRUCTIONS  Avoid bending, heavy lifting, prolonged sitting, and activities which make the problem worse.  Take brief periods of rest throughout the day to reduce your pain. Lying down or standing usually is better than sitting while you are resting.  Take over-the-counter or prescription medicines only as directed by your caregiver. SEEK IMMEDIATE MEDICAL CARE IF:   You have weakness or numbness in one of your legs or feet.  You have trouble controlling your bladder or bowels.  You have nausea, vomiting, abdominal pain, shortness of breath, or fainting. Document Released: 11/23/2004 Document Revised: 01/08/2012 Document Reviewed: 09/30/2011 [facility] Patient Information 2015 [place], [place]. This information is not intended to replace advice given to you by your health care provider. Make sure you discuss any questions you have with your health care provider.   Emergency Department Resource Guide 1) Find a Doctor and Pay Out of Pocket Although you won't have to find out who is covered by your insurance plan, it is a good idea to ask around and get recommendations. You will then need to call the office and see if the doctor you have chosen will accept you as a new patient and what types of  options they offer for patients who are self-pay. Some doctors offer discounts or will set up payment plans for their patients who do not have insurance, but you will need to ask so you aren't surprised when you get to your appointment.  2) Contact Your Local Health Department Not all health departments have doctors that can see patients for sick visits, but many do, so it is worth a call to see if yours does. If you don't know where your local health department is, you can check in your phone book. The CDC also has a tool to help you locate your state's health department, and many state websites also have listings of all of their local health departments.  3) Find a [facility] If your illness is not likely to be very severe or complicated, you may want to try a walk in clinic. These are popping up all over the country in pharmacies, drugstores, and shopping centers. They're usually staffed by nurse practitioners or physician assistants that have been trained to treat common illnesses and complaints. They're usually fairly quick and inexpensive. However, if you have serious medical issues or chronic medical problems, these are probably not your best option.  No Primary Care Doctor: - Call Health Connect at  [phone] - they can help you locate a primary care doctor that  accepts your insurance, provides certain services, etc. - Physician Referral Service- [phone]  Chronic Pain Problems: Organization         Address  Phone   Notes  [facility]  [phone] Patients need to be referred by their primary care doctor.  Medication Assistance: Organization         Address  Phone   Notes  [facility] Medication [facility] [address] [place], [place] [address] [phone] --Must be a resident of [facility] -- Must have NO insurance coverage whatsoever (no Medicaid/ Medicare, etc.) -- The pt. MUST have a primary care doctor that directs  their care regularly and follows them in the community   MedAssist  [phone]   [employer]  [phone]    Agencies that provide inexpensive medical care: Organization         Address  Phone   Notes  [place]  [phone]   [facility]    [phone]   [facility] [place], [place] [address] [phone]   [place] [address], [place] [phone]   Planned Parenthood    ([phone]   [facility]    [phone]   [place] and [address], [place] Phone:  [phone], Fax:  [phone] Hours of Operation:  9 am - 6 pm, M-F.  Also accepts Medicaid/Medicare and self-pay.  [facility] for [place], [address], [place] Phone: [phone], Fax: [phone]. Hours of Operation:  8:30 am - 5:30 pm, M-F.  Also accepts Medicaid and self-pay.  [facility] [address], [place] Phone: [phone]   [place], [place], [place] [phone], Ext. [phone] Mondays & Thursdays: 7-9 AM.  First 15 patients are seen on a first come, first serve basis.    [place] Providers:  Organization         Address  Phone   Notes  [facility] [address], [place] [phone] Also accepts self-pay patients.  [facility] [address], [place]  [phone]   [place], [address], [place] [phone]   [facility] Family Medicine [address], [place] [phone]   Lucianne Lei [address], [place]   [phone] Only accepts [place] Access [place] patients after they have their name applied to their card.   Self-Pay (no insurance) in [facility]:  Organization          Address  Phone   Notes  Sickle Cell Patients, [facility] Internal Medicine [place] [phone]   [facility] [place] [phone]   [facility] [place]  [place], [place],  [phone]   [facility]/Dr. Osei-Bonsu  [address] [place] or [address], [place] [phone] Phone number for both [place] and [place] locations is the same.  Urgent Medical and [facility] [address], [place] [phone]   [facility] [address], [place] or [address] [phone]   [facility] [address], [place] [phone], phone; [phone], fax Sees patients 1st and 3rd Saturday of every month.  Must not qualify for public or private insurance (i.e. Medicaid, Medicare,  Health Choice, Veterans' Benefits)  Household income should be no more than 200% of the poverty level The  clinic cannot treat you if you are pregnant or think you are pregnant  Sexually transmitted diseases are not treated at the clinic.    Dental Care: Organization         Address  Phone  Notes  [facility] Department of [facility] [place] [phone] Accepts children up to age 41 who are enrolled in [place] or [place]; pregnant women with a Medicaid card; and children who have applied for Medicaid or [place] Health Choice, but were declined, whose parents can pay a reduced fee at time of service.  [facility] Department of [facility]  [address], [place] [phone] Accepts children up to age 68 who are enrolled in [place] or [place]; pregnant women with a Medicaid card; and children who have applied for Medicaid or [place] Health Choice, but were declined, whose parents can pay a reduced fee at time of  service.  [place] Adult Dental Access PROGRAM  [place] [phone] Patients are seen by appointment only. Walk-ins are not accepted. [place] will see patients 73 years of age and older. Monday - Tuesday (8am-5pm) Most Wednesdays (8:30-5pm) $30 per visit, cash only  [facility] Adult Dental Access PROGRAM  [address], [facility] [phone] Patients are seen by appointment only. Walk-ins are not accepted. [place] will see patients 82 years of age and older. One Wednesday Evening (Monthly: Volunteer Based).  $30 per visit, cash only  [place]  [phone] for adults; Children under age 81, call Graduate Pediatric Dentistry at [phone]. Children aged 41-14, please call [phone] to request a pediatric application.  Dental services are provided in all areas of dental care including fillings, crowns and bridges, complete and partial dentures, implants, gum treatment, root canals, and extractions. Preventive care is also provided. Treatment is provided to both adults and children. Patients are selected via a lottery and there is often a waiting list.   [facility] [address], [place]  [phone] [email or web address]   [facility] [address] [place], [place] [phone], Ext. [phone] Second and Fourth Thursday of each month, opens at 6:30 AM; Clinic ends at 9 AM.  Patients are seen on a first-come first-served basis, and a limited number are seen during each clinic.   [facility]  [address] Hillard Danker [place], [place] [phone]   Eligibility Requirements You must have lived in [place], [place], or [place] counties for at least the last three months.   You cannot be eligible for state or federal sponsored [employer], including [employer], [place], or [employer].   You generally cannot be eligible for healthcare insurance through your employer.     How to apply: Eligibility screenings are held every Tuesday and Wednesday afternoon from 1:00 pm until 4:00 pm. You do not need an appointment for the interview!  [facility] [address], [place], [place]   [place]  [place] Department  [place]  [phone]    Behavioral Health Resources in the Community: Intensive Outpatient Programs Organization         Address  Phone  Notes  [place]. [address] [place], [place] [phone]   [facility] Outpatient [address], [place],  [place] [phone]   ADS: Alcohol & Drug Svcs [address] [place], [place]   [place] [address]  [place], [place] or [phone]   Substance Abuse Resources Organization         Address  Phone  Notes  Alcohol and Drug Services  [phone]   [place]  [phone]   [facility] [place]   Chinita Pester  [phone]   Residential & Outpatient Substance Abuse Program  [phone]   Psychological Services Organization         Address  Phone  Notes  [facility]  [place]  [phone]   [place] [address], [place] or [phone]    Mobile Crisis Teams Organization         Address  Phone  Notes  [facility], Mobile Crisis Care Unit  [phone]   Assertive Psychotherapeutic Services  [address] [place], [place]   [place] [address] [place] [phone]    Self-Help/Support Groups Organization         Address  Phone             Notes  [place]. of [place] - variety of support groups  [place] Call for more information  Narcotics Anonymous (NA), [facility]aring [facility] [address]  Dr, [employer] [place]  2 meetings at this location   Special educational needs teacher         Address  Phone  Notes  ASAP Residential Treatment [place],    [place]  [phone]   [facility]  [address], [place] [phone], [place], [place]   Blair Peterson, [place] [phone] Admissions: 8am-3pm M-F  Incentives Substance [place] [address]    [place], [place] [phone]   [facility] [address] [place], [place], [place]   [facility] [address]  [place], [place]   [facility] Programs - Intensive Outpatient [address], [place], [place]   [facility] ([place].,  [place], [place] [phone] or [phone]   [facility] Services (RTS) [address], [place], [place] Accepts Medicaid  [facility] [place] [address]  [place] [phone] Substance Abuse/Addiction Treatment   [facility] Organization         Address  Phone  Notes  [facility]  [phone]   Domenic Schwab, PhD [address] [place]lis [place], [place]   [phone] or [phone]   [place], [place] [phone]   [facility] [address], [place], [place] [phone] Insurance/Medicaid/sponsorship through [facility] and Families [address]                                    [place], [place] [phone] [place] [address] [place], [place] [phone]    Dr. Adele Schilder  [phone]   [facility] Dept. 1) [address] [place] 2) [place] 3)  [address], [place] [phone]  (  [place] [phone] or [phone] (After Hours)

## 2015-06-05 NOTE — ED Notes (Signed)
Pt has a ride home.  

## 2015-06-05 NOTE — ED Notes (Signed)
Pt arrives to the ER via EMS for complaints of chronic rt hip pain radiating down rt leg; pt states that this is a chronic problem and that she has recently moved down here and does not have a PCP yet to prescribe her her Narcotic pain meds; pt was seen on 8/1 for same thing; no recent injury or trauma

## 2015-06-05 NOTE — ED Notes (Signed)
Pt received 90 tabs of Flexeril and 30 tabs of Naprosyn on 8/1

## 2015-08-12 ENCOUNTER — Other Ambulatory Visit: Payer: Self-pay | Admitting: Internal Medicine

## 2015-08-12 DIAGNOSIS — M79606 Pain in leg, unspecified: Secondary | ICD-10-CM

## 2015-08-12 DIAGNOSIS — R200 Anesthesia of skin: Secondary | ICD-10-CM

## 2015-08-22 ENCOUNTER — Encounter (HOSPITAL_COMMUNITY): Payer: Self-pay | Admitting: *Deleted

## 2015-08-22 ENCOUNTER — Emergency Department (HOSPITAL_COMMUNITY)
Admission: EM | Admit: 2015-08-22 | Discharge: 2015-08-23 | Disposition: A | Discharge status: Home / Self Care | Payer: Medicare Other | Attending: Emergency Medicine | Admitting: Emergency Medicine

## 2015-08-22 DIAGNOSIS — Y998 Other external cause status: Secondary | ICD-10-CM | POA: Diagnosis not present

## 2015-08-22 DIAGNOSIS — Y92190 Kitchen in other specified residential institution as the place of occurrence of the external cause: Secondary | ICD-10-CM | POA: Insufficient documentation

## 2015-08-22 DIAGNOSIS — M3210 Systemic lupus erythematosus, organ or system involvement unspecified: Secondary | ICD-10-CM | POA: Insufficient documentation

## 2015-08-22 DIAGNOSIS — Z7982 Long term (current) use of aspirin: Secondary | ICD-10-CM | POA: Diagnosis not present

## 2015-08-22 DIAGNOSIS — T2601XA Burn of right eyelid and periocular area, initial encounter: Secondary | ICD-10-CM | POA: Insufficient documentation

## 2015-08-22 DIAGNOSIS — T2111XA Burn of first degree of chest wall, initial encounter: Secondary | ICD-10-CM | POA: Insufficient documentation

## 2015-08-22 DIAGNOSIS — T2016XA Burn of first degree of forehead and cheek, initial encounter: Secondary | ICD-10-CM | POA: Insufficient documentation

## 2015-08-22 DIAGNOSIS — Z86718 Personal history of other venous thrombosis and embolism: Secondary | ICD-10-CM | POA: Diagnosis not present

## 2015-08-22 DIAGNOSIS — Y93G3 Activity, cooking and baking: Secondary | ICD-10-CM | POA: Insufficient documentation

## 2015-08-22 DIAGNOSIS — Z720 Tobacco use: Secondary | ICD-10-CM | POA: Diagnosis not present

## 2015-08-22 DIAGNOSIS — Z86711 Personal history of pulmonary embolism: Secondary | ICD-10-CM | POA: Diagnosis not present

## 2015-08-22 DIAGNOSIS — Z7901 Long term (current) use of anticoagulants: Secondary | ICD-10-CM | POA: Insufficient documentation

## 2015-08-22 DIAGNOSIS — X102XXA Contact with fats and cooking oils, initial encounter: Secondary | ICD-10-CM | POA: Insufficient documentation

## 2015-08-22 DIAGNOSIS — Z79899 Other long term (current) drug therapy: Secondary | ICD-10-CM | POA: Diagnosis not present

## 2015-08-22 DIAGNOSIS — Z8679 Personal history of other diseases of the circulatory system: Secondary | ICD-10-CM | POA: Insufficient documentation

## 2015-08-22 DIAGNOSIS — T2006XA Burn of unspecified degree of forehead and cheek, initial encounter: Secondary | ICD-10-CM | POA: Diagnosis present

## 2015-08-22 DIAGNOSIS — T22112A Burn of first degree of left forearm, initial encounter: Secondary | ICD-10-CM

## 2015-08-22 DIAGNOSIS — Z8673 Personal history of transient ischemic attack (TIA), and cerebral infarction without residual deficits: Secondary | ICD-10-CM | POA: Insufficient documentation

## 2015-08-22 DIAGNOSIS — T2000XA Burn of unspecified degree of head, face, and neck, unspecified site, initial encounter: Secondary | ICD-10-CM

## 2015-08-22 MED ORDER — FLUORESCEIN SODIUM 1 MG OP STRP: 1.0000 | ORAL_STRIP | Freq: Once | OPHTHALMIC | Status: DC

## 2015-08-22 MED ORDER — TETRACAINE HCL 0.5 % OP SOLN
1.0000 [drp] | Freq: Once | OPHTHALMIC | Status: AC
  Administered 2015-08-22: 1 [drp] via OPHTHALMIC
  Filled 2015-08-22: qty 2

## 2015-08-22 MED ORDER — OXYCODONE-ACETAMINOPHEN 5-325 MG PO TABS
1.0000 | ORAL_TABLET | Freq: Once | ORAL | Status: AC
  Administered 2015-08-22: 1 via ORAL
  Filled 2015-08-22: qty 1

## 2015-08-22 MED ORDER — FLUORESCEIN SODIUM 1 MG OP STRP
1.0000 | ORAL_STRIP | Freq: Once | OPHTHALMIC | Status: AC
  Administered 2015-08-22: 1 via OPHTHALMIC
  Filled 2015-08-22: qty 1
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
## 2015-08-23 DIAGNOSIS — T2016XA Burn of first degree of forehead and cheek, initial encounter: Secondary | ICD-10-CM | POA: Diagnosis not present

## 2015-08-23 MED ORDER — SILVER SULFADIAZINE 1 % EX CREA
TOPICAL_CREAM | Freq: Once | CUTANEOUS | Status: AC
  Administered 2015-08-23: via TOPICAL
  Filled 2015-08-23: qty 85

## 2015-08-23 MED ORDER — BACITRACIN ZINC 500 UNIT/GM EX OINT
TOPICAL_OINTMENT | Freq: Two times a day (BID) | CUTANEOUS | Status: DC
  Administered 2015-08-23: 1 via TOPICAL
  Filled 2015-08-23: qty 0.9

## 2015-08-23 MED ORDER — HYDROCODONE-ACETAMINOPHEN 5-325 MG PO TABS: 1.0000 | ORAL_TABLET | Freq: Four times a day (QID) | ORAL | Status: DC | PRN

## 2015-08-23 NOTE — Discharge Instructions (Signed)
Do not take the narcotic if driving as it will make you sleepy. °

## 2015-08-24 ENCOUNTER — Other Ambulatory Visit (HOSPITAL_COMMUNITY): Payer: Self-pay | Admitting: Internal Medicine

## 2015-08-24 DIAGNOSIS — R609 Edema, unspecified: Secondary | ICD-10-CM

## 2015-08-25 ENCOUNTER — Ambulatory Visit (HOSPITAL_COMMUNITY)
Admission: RE | Admit: 2015-08-25 | Discharge: 2015-08-25 | Disposition: A | Discharge status: Home / Self Care | Payer: Medicare Other | Source: Ambulatory Visit | Attending: Internal Medicine | Admitting: Internal Medicine

## 2015-08-25 DIAGNOSIS — M7989 Other specified soft tissue disorders: Secondary | ICD-10-CM | POA: Diagnosis present

## 2015-08-25 DIAGNOSIS — Z86718 Personal history of other venous thrombosis and embolism: Secondary | ICD-10-CM | POA: Diagnosis not present

## 2015-08-25 DIAGNOSIS — R609 Edema, unspecified: Secondary | ICD-10-CM | POA: Diagnosis not present

## 2015-08-25 DIAGNOSIS — Z7901 Long term (current) use of anticoagulants: Secondary | ICD-10-CM | POA: Diagnosis not present

## 2015-08-25 NOTE — Progress Notes (Signed)
Preliminary results by tech - Right Lower Ext. Venous Completed. Negative for deep and superficial vein thrombosis in the right lower extremity. Oda Cogan, BS, RDMS, RVT

## 2015-08-28 ENCOUNTER — Other Ambulatory Visit: Payer: Medicare Other

## 2015-09-06 ENCOUNTER — Ambulatory Visit
Admission: RE | Admit: 2015-09-06 | Discharge: 2015-09-06 | Disposition: A | Discharge status: Home / Self Care | Payer: Medicare Other | Source: Ambulatory Visit | Attending: Internal Medicine | Admitting: Internal Medicine

## 2015-09-06 DIAGNOSIS — R200 Anesthesia of skin: Secondary | ICD-10-CM

## 2015-09-06 DIAGNOSIS — M79606 Pain in leg, unspecified: Secondary | ICD-10-CM

## 2015-09-22 ENCOUNTER — Emergency Department (HOSPITAL_COMMUNITY): Payer: Medicare Other

## 2015-09-22 ENCOUNTER — Encounter (HOSPITAL_COMMUNITY): Payer: Self-pay | Admitting: Emergency Medicine

## 2015-09-22 ENCOUNTER — Emergency Department (HOSPITAL_COMMUNITY)
Admission: EM | Admit: 2015-09-22 | Discharge: 2015-09-22 | Disposition: A | Discharge status: Home / Self Care | Payer: Medicare Other | Attending: Emergency Medicine | Admitting: Emergency Medicine

## 2015-09-22 DIAGNOSIS — Y92000 Kitchen of unspecified non-institutional (private) residence as  the place of occurrence of the external cause: Secondary | ICD-10-CM | POA: Insufficient documentation

## 2015-09-22 DIAGNOSIS — I4891 Unspecified atrial fibrillation: Secondary | ICD-10-CM | POA: Diagnosis not present

## 2015-09-22 DIAGNOSIS — Z86711 Personal history of pulmonary embolism: Secondary | ICD-10-CM | POA: Insufficient documentation

## 2015-09-22 DIAGNOSIS — M545 Low back pain, unspecified: Secondary | ICD-10-CM

## 2015-09-22 DIAGNOSIS — Z7982 Long term (current) use of aspirin: Secondary | ICD-10-CM | POA: Insufficient documentation

## 2015-09-22 DIAGNOSIS — W1839XA Other fall on same level, initial encounter: Secondary | ICD-10-CM | POA: Diagnosis not present

## 2015-09-22 DIAGNOSIS — Y93G3 Activity, cooking and baking: Secondary | ICD-10-CM | POA: Insufficient documentation

## 2015-09-22 DIAGNOSIS — Z8739 Personal history of other diseases of the musculoskeletal system and connective tissue: Secondary | ICD-10-CM | POA: Diagnosis not present

## 2015-09-22 DIAGNOSIS — Z8673 Personal history of transient ischemic attack (TIA), and cerebral infarction without residual deficits: Secondary | ICD-10-CM | POA: Insufficient documentation

## 2015-09-22 DIAGNOSIS — S3992XA Unspecified injury of lower back, initial encounter: Secondary | ICD-10-CM | POA: Diagnosis present

## 2015-09-22 DIAGNOSIS — W19XXXA Unspecified fall, initial encounter: Secondary | ICD-10-CM

## 2015-09-22 DIAGNOSIS — F17210 Nicotine dependence, cigarettes, uncomplicated: Secondary | ICD-10-CM | POA: Insufficient documentation

## 2015-09-22 DIAGNOSIS — Z86718 Personal history of other venous thrombosis and embolism: Secondary | ICD-10-CM | POA: Insufficient documentation

## 2015-09-22 DIAGNOSIS — Y998 Other external cause status: Secondary | ICD-10-CM | POA: Insufficient documentation

## 2015-09-22 DIAGNOSIS — Z79899 Other long term (current) drug therapy: Secondary | ICD-10-CM | POA: Diagnosis not present

## 2015-09-22 HISTORY — DX: Other intervertebral disc degeneration, lumbar region without mention of lumbar back pain or lower extremity pain: M51.369

## 2015-09-22 HISTORY — DX: Other intervertebral disc degeneration, lumbar region: M51.36

## 2015-09-22 MED ORDER — OXYCODONE-ACETAMINOPHEN 5-325 MG PO TABS
1.0000 | ORAL_TABLET | Freq: Once | ORAL | Status: AC
  Administered 2015-09-22: 1 via ORAL
  Filled 2015-09-22: qty 1

## 2015-09-22 MED ORDER — OXYCODONE-ACETAMINOPHEN 5-325 MG PO TABS: 1.0000 | ORAL_TABLET | Freq: Once | ORAL | Status: DC

## 2015-09-22 NOTE — ED Notes (Signed)
Pt with hx of lupus and recently found out she has DDD. Pt sts she was cooking and she began to have back pain and her legs gave out. Pain reports back pain worse after fall. Pt having difficulty walking and now having sharp pains down bilateral legs. Pt denies loss of bowel or urine.

## 2015-09-22 NOTE — Discharge Instructions (Signed)

## 2015-09-22 NOTE — ED Provider Notes (Signed)
CSN: [id number]     Arrival date & time 09/22/15  1650 History   First MD Initiated Contact with Patient 09/22/15 1709     Chief Complaint  Patient presents with  . Back Pain   (Consider location/radiation/quality/duration/timing/severity/associated sxs/prior Treatment) HPI Comments: She reports lower back pain and bilateral leg pain since 2005; however she reports falling this morning while in the kitchen and landing directly on her buttocks which severely increased her lower back and bilateral leg pain. She was able to ambulate immediately after fall, however she reports her pain increased throughout the day which prompted her to come to the emergency department. She reports some weakness in her bilateral leg and says this is secondary to her pain. She denies any saddle anesthesia, urinary incontinence or retention, fevers or chills, recent infections, history of chronic steroid use. She has a history of lupus but has not been on chronic steroids.  Patient is a 39 y.o. female presenting with back pain.  Back Pain Location:  Lumbar spine Quality:  Burning and shooting Radiates to: Bilateral legs. Pain severity:  Moderate Pain is:  Same all the time Onset quality:  Sudden Duration:  1 day Timing:  Constant Progression:  Worsening Context: falling   Context: not emotional stress, not lifting heavy objects, not MVA, not occupational injury, not recent illness and not recent injury   Relieved by:  Nothing Worsened by:  Ambulation, bending and movement Associated symptoms: leg pain and weakness   Associated symptoms: no bladder incontinence, no bowel incontinence, no dysuria, no fever, no numbness, no paresthesias, no perianal numbness and no weight loss   Risk factors: obesity   Risk factors: no hx of cancer, not pregnant, no recent surgery and no steroid use     Past Medical History  Diagnosis Date  . Lupus ([place])   . Pulmonary embolism ([place])   . DVT (deep venous thrombosis) ([place])    . Atrial fibrillation ([place])   . TIA (transient ischemic attack)   . DDD (degenerative disc disease), lumbar    Past Surgical History  Procedure Laterality Date  . Gastric bypass     No family history on file. Social History  Substance Use Topics  . Smoking status: Current Every Day Smoker    Types: Cigarettes  . Smokeless tobacco: None  . Alcohol Use: No   OB History    No data available     Review of Systems  Constitutional: Negative for fever, weight loss, activity change, appetite change and fatigue.  Respiratory: Negative.   Cardiovascular: Negative.   Gastrointestinal: Negative for bowel incontinence.  Genitourinary: Negative for bladder incontinence and dysuria.  Musculoskeletal: Positive for myalgias and back pain.  Neurological: Positive for weakness. Negative for numbness and paresthesias.      Allergies  Review of patient's allergies indicates no known allergies.  Home Medications   Prior to Admission medications   Medication Sig Start Date End Date Taking? Authorizing Provider  aspirin 81 MG tablet Take 1 tablet (81 mg total) by mouth 3 (three) times daily. 05/31/15  Yes Montine Circle, PA-C  butalbital-acetaminophen-caffeine (FIORICET, ESGIC) 50-325-40 MG per tablet Take 1 tablet by mouth every 6 (six) hours as needed for headache or migraine. 05/31/15  Yes Montine Circle, PA-C  clotrimazole (LOTRIMIN) 1 % external solution Apply 1 application topically at bedtime. 05/31/15  Yes Montine Circle, PA-C  hydroxychloroquine (PLAQUENIL) 200 MG tablet Take 1 tablet (200 mg total) by mouth 3 (three) times daily. 05/31/15  Yes Montine Circle,  PA-C  meclizine (ANTIVERT) 12.5 MG tablet Take 1 tablet (12.5 mg total) by mouth 2 (two) times daily. 05/31/15  Yes Montine Circle, PA-C  rivaroxaban (XARELTO) 20 MG TABS tablet Take 1 tablet (20 mg total) by mouth daily. 05/31/15  Yes Montine Circle, PA-C  oxyCODONE-acetaminophen (PERCOCET/ROXICET) 5-325 MG tablet Take 1 tablet by  mouth once. 09/22/15   Olam Idler, MD   BP 113/84 mmHg  Pulse 75  Temp(Src) 98.3 F (36.8 C) (Oral)  Resp 16  Ht 5\' 5"  (1.651 m)  Wt 132.904 kg  BMI 48.76 kg/m2  SpO2 100%  LMP 09/18/2015 Physical Exam  Constitutional: She is oriented to person, place, and time. She appears well-developed and well-nourished. No distress.  Neck: Normal range of motion. Neck supple.  Cardiovascular: Normal rate and regular rhythm.  Exam reveals no friction rub.   No murmur heard. Pulmonary/Chest: Effort normal and breath sounds normal. No respiratory distress.  Abdominal: Soft. Bowel sounds are normal. She exhibits no distension.  Musculoskeletal:  Normal skin w/o rash, Spine with normal alignment and no deformity. No tenderness to vertebral process palpation.  Paraspinous muscles are moderately tender and without spasm.  Straight leg raise negative bilaterally. Strength and sensation intact in lower extremities.   Neurological: She is alert and oriented to person, place, and time.  Skin: Skin is warm. No rash noted.  Nursing note and vitals reviewed.   ED Course  Procedures (including critical care time) Labs Review Labs Reviewed - No data to display  Imaging Review Dg Lumbar Spine 2-3 Views  09/22/2015  CLINICAL DATA:  Lumbago with bilateral radicular symptoms following fall EXAM: LUMBAR SPINE - 2-3 VIEW COMPARISON:  Lumbar MRI September 06, 2015. FINDINGS: Frontal, lateral, and spot lumbosacral lateral images were obtained. There are 5 non-rib-bearing lumbar type vertebral bodies. There is no fracture or spondylolisthesis. Disc spaces appear intact. No erosive change. There is a filter in the inferior vena cava centered at the level of L2. There is an intrauterine device slightly to the right of midline in the pelvis. IMPRESSION: No fracture or spondylolisthesis.  No appreciable arthropathy. Electronically Signed   By: Lowella Grip III M.D.   On: 09/22/2015 19:04   Dg  Sacrum/coccyx  09/22/2015  CLINICAL DATA:  Patient's legs gave out and she fell onto the tail bone this morning. Low back pain radiating to both legs. EXAM: SACRUM AND COCCYX - 2+ VIEW COMPARISON:  None. FINDINGS: Sacrum is partially obscured by overlying stool in the rectum. Alignment appears normal. Bone cortex appears intact. Sacral struts and SI joints are symmetrical. No destructive bone lesions identified. Intrauterine device. IMPRESSION: No acute bony abnormalities. Electronically Signed   By: Lucienne Capers M.D.   On: 09/22/2015 19:03   I have personally reviewed and evaluated these images and lab results as part of my medical decision-making.   EKG Interpretation None      MDM   Final diagnoses:  Fall  Bilateral low back pain without sciatica    Patient comes in with acute on chronic lower back pain made worse after fall this morning. No red flags or signs of neurovascular compromise. X-rays reviewed negative for fracture. Pain improved with Norco in ED and she was able to ambulate unassisted. Discharged to follow up with PCP as scheduled on Monday.   Olam Idler, MD 09/22/2015, 7:25 PM PGY-3, [place] R Landa Mullinax, MD 09/22/15 1925  Leonard Schwartz, MD 09/22/15 1932

## 2015-10-14 ENCOUNTER — Ambulatory Visit: Payer: Medicare Other | Admitting: Neurology

## 2015-10-14 ENCOUNTER — Telehealth: Payer: Self-pay

## 2015-10-14 NOTE — Telephone Encounter (Signed)
Patient did not come to a new patient appointment today. 

## 2015-11-04 ENCOUNTER — Telehealth: Payer: Self-pay | Admitting: *Deleted

## 2015-11-04 ENCOUNTER — Encounter: Payer: Self-pay | Admitting: Neurology

## 2015-11-04 ENCOUNTER — Telehealth: Payer: Self-pay | Admitting: Neurology

## 2015-11-04 ENCOUNTER — Ambulatory Visit: Payer: Medicare Other | Admitting: Neurology

## 2015-11-04 NOTE — Telephone Encounter (Signed)
No showed new patient appointment. 

## 2015-11-04 NOTE — Telephone Encounter (Signed)
This is the second new patient no-show for an appointment, the patient will be discharged from practice.

## 2015-11-21 ENCOUNTER — Encounter (HOSPITAL_COMMUNITY): Payer: Self-pay

## 2015-11-21 ENCOUNTER — Emergency Department (HOSPITAL_COMMUNITY)
Admission: EM | Admit: 2015-11-21 | Discharge: 2015-11-21 | Disposition: A | Discharge status: Home / Self Care | Payer: Medicare Other | Attending: Emergency Medicine | Admitting: Emergency Medicine

## 2015-11-21 ENCOUNTER — Emergency Department (HOSPITAL_COMMUNITY): Payer: Medicare Other

## 2015-11-21 ENCOUNTER — Emergency Department (HOSPITAL_BASED_OUTPATIENT_CLINIC_OR_DEPARTMENT_OTHER)
Admission: RE | Admit: 2015-11-21 | Discharge: 2015-11-21 | Disposition: A | Discharge status: Home / Self Care | Payer: Medicare Other | Source: Ambulatory Visit

## 2015-11-21 DIAGNOSIS — F17210 Nicotine dependence, cigarettes, uncomplicated: Secondary | ICD-10-CM | POA: Insufficient documentation

## 2015-11-21 DIAGNOSIS — S93401A Sprain of unspecified ligament of right ankle, initial encounter: Secondary | ICD-10-CM

## 2015-11-21 DIAGNOSIS — Z8739 Personal history of other diseases of the musculoskeletal system and connective tissue: Secondary | ICD-10-CM | POA: Diagnosis not present

## 2015-11-21 DIAGNOSIS — Y998 Other external cause status: Secondary | ICD-10-CM | POA: Insufficient documentation

## 2015-11-21 DIAGNOSIS — Y9301 Activity, walking, marching and hiking: Secondary | ICD-10-CM | POA: Insufficient documentation

## 2015-11-21 DIAGNOSIS — Z79899 Other long term (current) drug therapy: Secondary | ICD-10-CM | POA: Insufficient documentation

## 2015-11-21 DIAGNOSIS — M79609 Pain in unspecified limb: Secondary | ICD-10-CM

## 2015-11-21 DIAGNOSIS — Z8673 Personal history of transient ischemic attack (TIA), and cerebral infarction without residual deficits: Secondary | ICD-10-CM | POA: Diagnosis not present

## 2015-11-21 DIAGNOSIS — Z7982 Long term (current) use of aspirin: Secondary | ICD-10-CM | POA: Diagnosis not present

## 2015-11-21 DIAGNOSIS — I4891 Unspecified atrial fibrillation: Secondary | ICD-10-CM | POA: Insufficient documentation

## 2015-11-21 DIAGNOSIS — Z86718 Personal history of other venous thrombosis and embolism: Secondary | ICD-10-CM | POA: Insufficient documentation

## 2015-11-21 DIAGNOSIS — W1840XA Slipping, tripping and stumbling without falling, unspecified, initial encounter: Secondary | ICD-10-CM | POA: Diagnosis not present

## 2015-11-21 DIAGNOSIS — Z86711 Personal history of pulmonary embolism: Secondary | ICD-10-CM | POA: Diagnosis not present

## 2015-11-21 DIAGNOSIS — Y9289 Other specified places as the place of occurrence of the external cause: Secondary | ICD-10-CM | POA: Insufficient documentation

## 2015-11-21 DIAGNOSIS — S8991XA Unspecified injury of right lower leg, initial encounter: Secondary | ICD-10-CM | POA: Diagnosis present

## 2015-11-21 DIAGNOSIS — Z7951 Long term (current) use of inhaled steroids: Secondary | ICD-10-CM | POA: Insufficient documentation

## 2015-11-21 DIAGNOSIS — S8391XA Sprain of unspecified site of right knee, initial encounter: Secondary | ICD-10-CM | POA: Insufficient documentation

## 2015-11-21 LAB — CBC WITH DIFFERENTIAL/PLATELET
BASOS PCT: 1 %
Basophils Absolute: 0 10*3/uL (ref 0.0–0.1)
Eosinophils Absolute: 0.1 10*3/uL (ref 0.0–0.7)
Eosinophils Relative: 2 %
HEMATOCRIT: 32.9 % — AB (ref 36.0–46.0)
HEMOGLOBIN: 11 g/dL — AB (ref 12.0–15.0)
LYMPHS PCT: 36 %
Lymphs Abs: 1.3 10*3/uL (ref 0.7–4.0)
MCH: 30.8 pg (ref 26.0–34.0)
MCHC: 33.4 g/dL (ref 30.0–36.0)
MCV: 92.2 fL (ref 78.0–100.0)
Monocytes Absolute: 0.5 10*3/uL (ref 0.1–1.0)
Monocytes Relative: 12 %
NEUTROS ABS: 1.8 10*3/uL (ref 1.7–7.7)
NEUTROS PCT: 49 %
Platelets: 257 10*3/uL (ref 150–400)
RBC: 3.57 MIL/uL — ABNORMAL LOW (ref 3.87–5.11)
RDW: 13.6 % (ref 11.5–15.5)
WBC: 3.7 10*3/uL — ABNORMAL LOW (ref 4.0–10.5)

## 2015-11-21 LAB — BASIC METABOLIC PANEL
ANION GAP: 8 (ref 5–15)
BUN: 11 mg/dL (ref 6–20)
CHLORIDE: 106 mmol/L (ref 101–111)
CO2: 26 mmol/L (ref 22–32)
Calcium: 9.1 mg/dL (ref 8.9–10.3)
Creatinine, Ser: 0.75 mg/dL (ref 0.44–1.00)
GFR calc non Af Amer: >60 mL/min (ref >60)
Glucose, Bld: 80 mg/dL (ref 65–99)
Potassium: 4.4 mmol/L (ref 3.5–5.1)
Sodium: 140 mmol/L (ref 135–145)

## 2015-11-21 MED ORDER — MORPHINE SULFATE (PF) 4 MG/ML IV SOLN
4.0000 mg | Freq: Once | INTRAVENOUS | Status: AC
  Administered 2015-11-21: 4 mg via INTRAVENOUS
  Filled 2015-11-21: qty 1

## 2015-11-21 MED ORDER — OXYCODONE-ACETAMINOPHEN 5-325 MG PO TABS: 2.0000 | ORAL_TABLET | ORAL | Status: AC | PRN

## 2015-11-21 MED ORDER — ONDANSETRON 4 MG PO TBDP
4.0000 mg | ORAL_TABLET | Freq: Once | ORAL | Status: AC
Start: 2015-11-21 — End: 2015-11-21
  Administered 2015-11-21: 4 mg via ORAL
  Filled 2015-11-21: qty 1

## 2015-11-21 NOTE — Discharge Instructions (Signed)
Knee Sprain Follow-up with orthopedics Dr. Sharol Given if you continue to have leg pain. Keep your follow-up appointment on 11/30/2015 with your PCP. A knee sprain is a tear in the strong bands of tissue that connect the bones (ligaments) of your knee. HOME CARE  Raise (elevate) your injured knee to lessen puffiness (swelling).  To ease pain and puffiness, put ice on the injured area.  Put ice in a plastic bag.  Place a towel between your skin and the bag.  Leave the ice on for 20 minutes, 2-3 times a day.  Only take medicine as told by your doctor.  Do not leave your knee unprotected until pain and stiffness go away (usually 4-6 weeks).  If you have a cast or splint, do not get it wet. If your doctor told you to not take it off, cover it with a plastic bag when you shower or bathe. Do not swim.  Your doctor may have you do exercises to prevent or limit permanent weakness and stiffness. GET HELP RIGHT AWAY IF:   Your cast or splint becomes damaged.  Your pain gets worse.  You have a lot of pain, puffiness, or numbness below the cast or splint. MAKE SURE YOU:   Understand these instructions.  Will watch your condition.  Will get help right away if you are not doing well or get worse.   This information is not intended to replace advice given to you by your health care provider. Make sure you discuss any questions you have with your health care provider.   Document Released: 10/04/2009 Document Revised: 10/21/2013 Document Reviewed: 06/24/2013 Elsevier Interactive Patient Education 2016 Else[employer]  Ankle Sprain An ankle sprain is an injury to the strong, fibrous tissues (ligaments) that hold the bones of your ankle joint together.  CAUSES An ankle sprain is usually caused by a fall or by twisting your ankle. Ankle sprains most commonly occur when you step on the outer edge of your foot, and your ankle turns inward. People who participate in sports are more prone to these types  of injuries.  SYMPTOMS   Pain in your ankle. The pain may be present at rest or only when you are trying to stand or walk.  Swelling.  Bruising. Bruising may develop immediately or within 1 to 2 days after your injury.  Difficulty standing or walking, particularly when turning corners or changing directions. DIAGNOSIS  Your caregiver will ask you details about your injury and perform a physical exam of your ankle to determine if you have an ankle sprain. During the physical exam, your caregiver will press on and apply pressure to specific areas of your foot and ankle. Your caregiver will try to move your ankle in certain ways. An X-ray exam may be done to be sure a bone was not broken or a ligament did not separate from one of the bones in your ankle (avulsion fracture).  TREATMENT  Certain types of braces can help stabilize your ankle. Your caregiver can make a recommendation for this. Your caregiver may recommend the use of medicine for pain. If your sprain is severe, your caregiver may refer you to a surgeon who helps to restore function to parts of your skeletal system (orthopedist) or a physical therapist. [place] ice to your injury for 1-2 days or as directed by your caregiver. Applying ice helps to reduce inflammation and pain.  Put ice in a plastic bag.  Place a towel between your skin  and the bag.  Leave the ice on for 15-20 minutes at a time, every 2 hours while you are awake.  Only take over-the-counter or prescription medicines for pain, discomfort, or fever as directed by your caregiver.  Elevate your injured ankle above the level of your heart as much as possible for 2-3 days.  If your caregiver recommends crutches, use them as instructed. Gradually put weight on the affected ankle. Continue to use crutches or a cane until you can walk without feeling pain in your ankle.  If you have a plaster splint, wear the splint as directed by your caregiver. Do  not rest it on anything harder than a pillow for the first 24 hours. Do not put weight on it. Do not get it wet. You may take it off to take a shower or bath.  You may have been given an elastic bandage to wear around your ankle to provide support. If the elastic bandage is too tight (you have numbness or tingling in your foot or your foot becomes cold and blue), adjust the bandage to make it comfortable.  If you have an air splint, you may blow more air into it or let air out to make it more comfortable. You may take your splint off at night and before taking a shower or bath. Wiggle your toes in the splint several times per day to decrease swelling. SEEK MEDICAL CARE IF:   You have rapidly increasing bruising or swelling.  Your toes feel extremely cold or you lose feeling in your foot.  Your pain is not relieved with medicine. SEEK IMMEDIATE MEDICAL CARE IF:  Your toes are numb or blue.  You have severe pain that is increasing. MAKE SURE YOU:   Understand these instructions.  Will watch your condition.  Will get help right away if you are not doing well or get worse.   This information is not intended to replace advice given to you by your health care provider. Make sure you discuss any questions you have with your health care provider.   Document Released: 10/16/2005 Document Revised: 11/06/2014 Document Reviewed: 10/28/2011 Elsevier Interactive Patient Education [employer].

## 2015-11-21 NOTE — ED Notes (Signed)
Pt. Reports waking up this morning at 0300 am and had pain around her waist and both or her legs.  She also noticed rt. Leg swelling and she is unable to use her rt. Leg.  Lt. Leg has no swelling.   Leg is swollen no pitting edema.  Pt. Slipped yesterday walking to her car , she caught her self and did not go down onto the grown

## 2015-11-21 NOTE — ED Provider Notes (Signed)
CSN: [id number]     Arrival date & time 11/21/15  0957 History   First MD Initiated Contact with Patient 11/21/15 1252     Chief Complaint  Patient presents with  . Leg Swelling   (Consider location/radiation/quality/duration/timing/severity/associated sxs/prior Treatment) The history is provided by the patient. No language interpreter was used.   Caitlin Houston is a 40 year old female with a past medical history of PE, lupus, DVT (anticoagulated with Xarelto), A. fib, and TIA who reports waking up 10 hours ago with pain around her waist and bilateral lower extremities. She also noted right leg swelling and was unable to use her right leg. She also mentioned that she slipped yesterday while walking to her car and caught herself but did not go down to the ground. She denies any head injury or loss of consciousness. He denies any recent illness, fever, chills, chest pain, shortness of breath, cough, abdominal pain, nausea, vomiting.  Past Medical History  Diagnosis Date  . Lupus ([place])   . Pulmonary embolism ([place])   . DVT (deep venous thrombosis) ([place])   . Atrial fibrillation ([place])   . TIA (transient ischemic attack)   . DDD (degenerative disc disease), lumbar    Past Surgical History  Procedure Laterality Date  . Gastric bypass     No family history on file. Social History  Substance Use Topics  . Smoking status: Current Every Day Smoker    Types: Cigarettes  . Smokeless tobacco: None  . Alcohol Use: No   OB History    No data available     Review of Systems  Constitutional: Negative for fever.  All other systems reviewed and are negative.     Allergies  Coconut flavor and Other  Home Medications   Prior to Admission medications   Medication Sig Start Date End Date Taking? Authorizing Provider  aspirin 81 MG tablet Take 1 tablet (81 mg total) by mouth 3 (three) times daily. Patient taking differently: Take 81 mg by mouth daily after supper.  05/31/15  Yes Montine Circle, PA-C  butalbital-acetaminophen-caffeine (FIORICET, ESGIC) 50-325-40 MG per tablet Take 1 tablet by mouth every 6 (six) hours as needed for headache or migraine. 05/31/15  Yes Montine Circle, PA-C  clotrimazole (LOTRIMIN) 1 % external solution Apply 1 application topically at bedtime. 05/31/15  Yes Montine Circle, PA-C  Fluticasone-Salmeterol (ADVAIR) 250-50 MCG/DOSE AEPB Inhale 1 puff into the lungs 2 (two) times daily.   Yes Historical Provider, MD  hydroxychloroquine (PLAQUENIL) 200 MG tablet Take 1 tablet (200 mg total) by mouth 3 (three) times daily. Patient taking differently: Take 200 mg by mouth 2 (two) times daily.  05/31/15  Yes Montine Circle, PA-C  loratadine (CLARITIN) 10 MG tablet Take 10 mg by mouth daily with lunch.   Yes Historical Provider, MD  meclizine (ANTIVERT) 12.5 MG tablet Take 1 tablet (12.5 mg total) by mouth 2 (two) times daily. Patient taking differently: Take 12.5 mg by mouth daily.  05/31/15  Yes Montine Circle, PA-C  rivaroxaban (XARELTO) 20 MG TABS tablet Take 1 tablet (20 mg total) by mouth daily. Patient taking differently: Take 20 mg by mouth daily with supper.  05/31/15  Yes Montine Circle, PA-C  oxyCODONE-acetaminophen (PERCOCET/ROXICET) 5-325 MG tablet Take 2 tablets by mouth every 4 (four) hours as needed for severe pain. 11/21/15   Benedict Kue Patel-Mills, PA-C   BP 100/63 mmHg  Pulse 67  Temp(Src) 98.9 F (37.2 C) (Oral)  Resp 20  Ht 5\' 6"  (1.676 m)  Wt  138.801 kg  BMI 49.41 kg/m2  SpO2 97%  LMP 09/21/2015 Physical Exam  Constitutional: She is oriented to person, place, and time. She appears well-developed and well-nourished.  HENT:  Head: Normocephalic and atraumatic.  Eyes: Conjunctivae are normal.  Neck: Normal range of motion. Neck supple.  Cardiovascular: Normal rate, regular rhythm and normal heart sounds.   Pulmonary/Chest: Effort normal and breath sounds normal. No respiratory distress. She has no wheezes. She has no rales.  No shortness  of breath. Lungs clear to auscultation bilaterally. Good air movement.  Abdominal: Soft. There is no tenderness.  Musculoskeletal: Normal range of motion.  Right lower extremity: Tenderness to palpation of the right ankle and knee. Moderate swelling of the lateral malleolus but no joint effusion. No fifth metatarsal pain. 2+ DP pulse. Able to flex and extend toes. Patellar tenderness. No joint effusion. Able to flex and extend knee. Able to straight leg raise. No calf tenderness.  Neurological: She is alert and oriented to person, place, and time.  Skin: Skin is warm and dry.  Psychiatric: She has a normal mood and affect.  Nursing note and vitals reviewed.   ED Course  Procedures (including critical care time) Labs Review Labs Reviewed  CBC WITH DIFFERENTIAL/PLATELET - Abnormal; Notable for the following:    WBC 3.7 (*)    RBC 3.57 (*)    Hemoglobin 11.0 (*)    HCT 32.9 (*)    All other components within normal limits  BASIC METABOLIC PANEL    Imaging Review Dg Ankle Complete Right  11/21/2015  CLINICAL DATA:  Right lower extremity edema and pain. EXAM: RIGHT ANKLE - COMPLETE 3+ VIEW COMPARISON:  None. FINDINGS: Soft tissue swelling is seen surrounding the ankle. No evidence of fracture, dislocation, significant arthropathy or bony lesion. IMPRESSION: Soft tissue swelling without evidence of underlying bony abnormality. Electronically Signed   By: Aletta Edouard M.D.   On: 11/21/2015 14:19   Dg Knee Complete 4 Views Right  11/21/2015  CLINICAL DATA:  Right lower extremity pain and edema. EXAM: RIGHT KNEE - COMPLETE 4+ VIEW COMPARISON:  None. FINDINGS: There is no evidence of fracture, dislocation, or joint effusion. There are mild proliferative changes involving the tibial spines and femoral condyles. No bony lesions or destruction. IMPRESSION: No acute findings.  Mild proliferative changes. Electronically Signed   By: Aletta Edouard M.D.   On: 11/21/2015 14:20   I have personally  reviewed and evaluated these images and lab results as part of my medical decision-making.   EKG Interpretation None      MDM   Final diagnoses:  Sprain of right lower leg, initial encounter  Ankle sprain, right, initial encounter   Patient presents for right leg pain after slip yesterday. She denies hitting the ground and states she caught herself by putting her hand up against the car. Her leg has been hurting since early this morning. She also reports a history of DVT. Her right ankle and knee are hurting and are significantly swollen. X-ray imaging of both the knee and ankle are negative for acute fracture or dislocation. She has no erythema or calf tenderness. Doppler was done to rule out DVT and is negative. Labs are unremarkable. His most likely a muscle strain and ankle sprain. Patient was put in Ace wrap. I discussed following up with orthopedics as well as keeping her PCP appointment in 9 days. Return precautions discussed and patient agrees with plan. Medications  morphine 4 MG/ML injection 4 mg (4 mg Intravenous  Given 11/21/15 1339)  ondansetron (ZOFRAN-ODT) disintegrating tablet 4 mg (4 mg Oral Given 11/21/15 1343)   Filed Vitals:   11/21/15 1500 11/21/15 1522  BP: 111/73 100/63  Pulse: 67   Temp:  98.9 F (37.2 C)  Resp: [address], PA-C 11/21/15 [place], DO 11/22/15 [id number]

## 2015-11-21 NOTE — Progress Notes (Signed)
VASCULAR LAB PRELIMINARY  PRELIMINARY  PRELIMINARY  PRELIMINARY  Right lower extremity venous duplex completed.    Preliminary report:  There is no DVT or SVT noted in the right lower extremity.   Jerrye Seebeck, RVT 11/21/2015, 2:27 PM

## 2016-02-05 ENCOUNTER — Encounter (HOSPITAL_COMMUNITY): Payer: Self-pay | Admitting: Emergency Medicine

## 2016-02-05 ENCOUNTER — Emergency Department (HOSPITAL_COMMUNITY): Payer: Medicare Other

## 2016-02-05 ENCOUNTER — Emergency Department (HOSPITAL_COMMUNITY)
Admission: EM | Admit: 2016-02-05 | Discharge: 2016-02-05 | Disposition: A | Discharge status: Home / Self Care | Payer: Medicare Other | Attending: Emergency Medicine | Admitting: Emergency Medicine

## 2016-02-05 DIAGNOSIS — Z7982 Long term (current) use of aspirin: Secondary | ICD-10-CM | POA: Insufficient documentation

## 2016-02-05 DIAGNOSIS — Z86711 Personal history of pulmonary embolism: Secondary | ICD-10-CM | POA: Insufficient documentation

## 2016-02-05 DIAGNOSIS — R0789 Other chest pain: Secondary | ICD-10-CM | POA: Insufficient documentation

## 2016-02-05 DIAGNOSIS — R4789 Other speech disturbances: Secondary | ICD-10-CM | POA: Diagnosis not present

## 2016-02-05 DIAGNOSIS — R000 Tachycardia, unspecified: Secondary | ICD-10-CM | POA: Insufficient documentation

## 2016-02-05 DIAGNOSIS — Z8739 Personal history of other diseases of the musculoskeletal system and connective tissue: Secondary | ICD-10-CM | POA: Diagnosis not present

## 2016-02-05 DIAGNOSIS — Z8673 Personal history of transient ischemic attack (TIA), and cerebral infarction without residual deficits: Secondary | ICD-10-CM | POA: Insufficient documentation

## 2016-02-05 DIAGNOSIS — Z79899 Other long term (current) drug therapy: Secondary | ICD-10-CM | POA: Diagnosis not present

## 2016-02-05 DIAGNOSIS — J45901 Unspecified asthma with (acute) exacerbation: Secondary | ICD-10-CM | POA: Diagnosis not present

## 2016-02-05 DIAGNOSIS — Z7901 Long term (current) use of anticoagulants: Secondary | ICD-10-CM | POA: Insufficient documentation

## 2016-02-05 DIAGNOSIS — Z86718 Personal history of other venous thrombosis and embolism: Secondary | ICD-10-CM | POA: Insufficient documentation

## 2016-02-05 DIAGNOSIS — R061 Stridor: Secondary | ICD-10-CM | POA: Insufficient documentation

## 2016-02-05 DIAGNOSIS — F17210 Nicotine dependence, cigarettes, uncomplicated: Secondary | ICD-10-CM | POA: Diagnosis not present

## 2016-02-05 DIAGNOSIS — Z7951 Long term (current) use of inhaled steroids: Secondary | ICD-10-CM | POA: Diagnosis not present

## 2016-02-05 DIAGNOSIS — R0602 Shortness of breath: Secondary | ICD-10-CM | POA: Diagnosis present

## 2016-02-05 LAB — CBC WITH DIFFERENTIAL/PLATELET
BASOS ABS: 0 10*3/uL (ref 0.0–0.1)
BASOS PCT: 0 %
Eosinophils Absolute: 0.1 10*3/uL (ref 0.0–0.7)
Eosinophils Relative: 1 %
HEMATOCRIT: 32.6 % — AB (ref 36.0–46.0)
HEMOGLOBIN: 10.7 g/dL — AB (ref 12.0–15.0)
Lymphocytes Relative: 32 %
Lymphs Abs: 1.2 10*3/uL (ref 0.7–4.0)
MCH: 29.6 pg (ref 26.0–34.0)
MCHC: 32.8 g/dL (ref 30.0–36.0)
MCV: 90.3 fL (ref 78.0–100.0)
MONOS PCT: 10 %
Monocytes Absolute: 0.4 10*3/uL (ref 0.1–1.0)
NEUTROS ABS: 2.2 10*3/uL (ref 1.7–7.7)
NEUTROS PCT: 57 %
Platelets: 276 10*3/uL (ref 150–400)
RBC: 3.61 MIL/uL — AB (ref 3.87–5.11)
RDW: 13.9 % (ref 11.5–15.5)
WBC: 3.9 10*3/uL — AB (ref 4.0–10.5)

## 2016-02-05 LAB — BASIC METABOLIC PANEL
ANION GAP: 11 (ref 5–15)
BUN: 10 mg/dL (ref 6–20)
CHLORIDE: 103 mmol/L (ref 101–111)
CO2: 24 mmol/L (ref 22–32)
Calcium: 8.9 mg/dL (ref 8.9–10.3)
Creatinine, Ser: 0.8 mg/dL (ref 0.44–1.00)
GFR calc non Af Amer: >60 mL/min (ref >60)
Glucose, Bld: 93 mg/dL (ref 65–99)
POTASSIUM: 3.6 mmol/L (ref 3.5–5.1)
Sodium: 138 mmol/L (ref 135–145)

## 2016-02-05 MED ORDER — PREDNISONE 50 MG PO TABS: 50.0000 mg | ORAL_TABLET | Freq: Every day | ORAL | Status: AC

## 2016-02-05 MED ORDER — RACEPINEPHRINE HCL 2.25 % IN NEBU
0.5000 mL | INHALATION_SOLUTION | Freq: Once | RESPIRATORY_TRACT | Status: AC
  Administered 2016-02-05: 0.5 mL via RESPIRATORY_TRACT
  Filled 2016-02-05: qty 0.5

## 2016-02-05 MED ORDER — KETOROLAC TROMETHAMINE 30 MG/ML IJ SOLN
30.0000 mg | Freq: Once | INTRAMUSCULAR | Status: AC
  Administered 2016-02-05: 30 mg via INTRAVENOUS
  Filled 2016-02-05: qty 1

## 2016-02-05 MED ORDER — IPRATROPIUM BROMIDE 0.02 % IN SOLN
0.5000 mg | Freq: Once | RESPIRATORY_TRACT | Status: AC
  Administered 2016-02-05: 0.5 mg via RESPIRATORY_TRACT
  Filled 2016-02-05: qty 2.5

## 2016-02-05 MED ORDER — ALBUTEROL (5 MG/ML) CONTINUOUS INHALATION SOLN
15.0000 mg | INHALATION_SOLUTION | Freq: Once | RESPIRATORY_TRACT | Status: AC
  Administered 2016-02-05: 15 mg via RESPIRATORY_TRACT
  Filled 2016-02-05: qty 20

## 2016-02-05 MED ORDER — SODIUM CHLORIDE 0.9 % IV BOLUS (SEPSIS)
1000.0000 mL | Freq: Once | INTRAVENOUS | Status: AC
  Administered 2016-02-05: 1000 mL via INTRAVENOUS

## 2016-02-05 NOTE — ED Notes (Signed)
PER [facility]: Patient to ED from home c/o SOB and wheezing all fields - pt was cleaning non-ventilation bathroom with toilet-bowl cleaner and bleach/chlorine when she started experiencing SOB and difficulty breathing about 30 mins ago. Pt hx asthma and blood clots (on Xarelto). Pt also had swelling all over face, which has almost resolved upon arrival. Respirations even, labored, tachypneic 36 breaths/min. EMS VS: 130/90, HR 80 NSR, 82% RA, 95% with neb treatments. 20g. LAC, given 125 solumedrol, 10mg  albuterol, and 0.5 atrovent. Pt A&O x 4.

## 2016-02-05 NOTE — ED Provider Notes (Signed)
CSN: [id number]     Arrival date & time 02/05/16  1657 History   First MD Initiated Contact with Patient 02/05/16 1658     Chief Complaint  Patient presents with  . Shortness of Breath  . 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 She remained alert but tachypnic throughout transport.  Past Medical History  Diagnosis Date  . Lupus ([place])   . Pulmonary embolism ([place])   . DVT (deep venous thrombosis) ([place])   . Atrial fibrillation ([place])   . TIA (transient ischemic attack)   . DDD (degenerative disc disease), lumbar    Past Surgical History  Procedure Laterality Date  . Gastric bypass     History reviewed. No pertinent family history. Social History  Substance Use Topics  . Smoking status: Current Every Day Smoker    Types: Cigarettes  . Smokeless tobacco: None  . Alcohol Use: No   OB History    No data available     Review of Systems  Constitutional: Positive for activity change. Negative for fever and chills.  HENT: Positive for sore throat. Negative for congestion, rhinorrhea, sinus pressure and sneezing.   Respiratory: Positive for cough, chest tightness, shortness of breath, wheezing and stridor.   Cardiovascular: Positive for chest pain (diffuse chest wall tenderness, acute on chronic).  Gastrointestinal: Negative for nausea, vomiting, abdominal pain and  diarrhea.  Genitourinary: Negative for dysuria and difficulty urinating.  Skin: Negative for rash and wound.  Neurological: Positive for speech difficulty. Negative for dizziness, syncope, weakness, light-headedness and headaches.  All other systems reviewed and are negative.     Allergies  Coconut flavor and Other  Home Medications   Prior to Admission medications   Medication Sig Start Date End Date Taking? Authorizing Provider  aspirin 81 MG tablet Take 1 tablet (81 mg total) by mouth 3 (three) times daily. Patient taking differently: Take 81 mg by mouth daily after supper.  05/31/15   Montine Circle, PA-C  butalbital-acetaminophen-caffeine (FIORICET, ESGIC) [phone] MG per tablet Take 1 tablet by mouth every 6 (six) hours as needed for headache or migraine. 05/31/15   Montine Circle, PA-C  clotrimazole (LOTRIMIN) 1 % external solution Apply 1 application topically at bedtime. 05/31/15   Montine Circle, PA-C  Fluticasone-Salmeterol (ADVAIR) 250-50 MCG/DOSE AEPB Inhale 1 puff into the lungs 2 (two) times daily.    Historical Provider, MD  hydroxychloroquine (PLAQUENIL) 200 MG tablet Take 1 tablet (200 mg total) by mouth 3 (three) times daily. Patient taking differently: Take 200 mg by mouth 2 (two) times daily.  05/31/15   Montine Circle, PA-C  loratadine (CLARITIN) 10 MG tablet Take 10 mg by mouth daily with lunch.    Historical Provider, MD  meclizine (ANTIVERT) 12.5 MG tablet Take 1 tablet (12.5 mg total) by mouth 2 (two) times daily. Patient taking differently: Take 12.5 mg by mouth daily.  05/31/15   Montine Circle, PA-C  oxyCODONE-acetaminophen (PERCOCET/ROXICET) 5-325 MG tablet Take  2 tablets by mouth every 4 (four) hours as needed for severe pain. 11/21/15   Hanna Patel-Mills, PA-C  predniSONE (DELTASONE) 50 MG tablet Take 1 tablet (50 mg total) by mouth daily. 02/05/16 02/09/16  Zenovia Jarred, DO  rivaroxaban (XARELTO) 20 MG TABS tablet Take 1 tablet (20 mg total) by mouth  daily. Patient taking differently: Take 20 mg by mouth daily with supper.  05/31/15   Montine Circle, PA-C   BP 114/66 mmHg  Pulse 100  Temp(Src) 97.7 F (36.5 C) (Oral)  Resp 18  SpO2 100% Physical Exam  Constitutional: She is oriented to person, place, and time. She appears well-developed and well-nourished. She appears distressed.  HENT:  Head: Normocephalic and atraumatic.  Mouth/Throat: Oropharynx is clear and moist.  Eyes: Conjunctivae and EOM are normal. Pupils are equal, round, and reactive to light.  Neck: Normal range of motion. Neck supple.  Cardiovascular: Normal heart sounds and intact distal pulses.  Tachycardia present.   Pulmonary/Chest: She is in respiratory distress. She has wheezes. She exhibits tenderness.  Abdominal: Soft. She exhibits no distension. There is no tenderness.  Musculoskeletal: She exhibits no edema or tenderness.  Neurological: She is alert and oriented to person, place, and time. No cranial nerve deficit. Coordination normal.  Skin: Skin is warm and dry. She is not diaphoretic.  Nursing note and vitals reviewed.   ED Course  Procedures (including critical care time) Labs Review Labs Reviewed  CBC WITH DIFFERENTIAL/PLATELET - Abnormal; Notable for the following:    WBC 3.9 (*)    RBC 3.61 (*)    Hemoglobin 10.7 (*)    HCT 32.6 (*)    All other components within normal limits  BASIC METABOLIC PANEL    Imaging Review Dg Chest Portable 1 View  02/05/2016  CLINICAL DATA:  40 year old presenting with acute onset of shortness of breath and wheezing. EXAM: PORTABLE CHEST 1 VIEW COMPARISON:  None. FINDINGS: Markedly suboptimal inspiration which accounts for atelectasis in the lung bases and accentuates the cardiac silhouette. Taking this into account, cardiac silhouette mildly enlarged. Lungs otherwise clear. Pulmonary vascularity normal. No visible pleural effusions. IMPRESSION: 1. Suboptimal inspiration accounts for bibasilar atelectasis. No acute  cardiopulmonary disease otherwise. 2. Mild cardiomegaly without pulmonary edema. Electronically Signed   By: Evangeline Dakin M.D.   On: 02/05/2016 17:39   I have personally reviewed and evaluated these images and lab results as part of my medical decision-making.   EKG Interpretation   Date/Time:  Saturday February 05 2016 17:11:04 EDT Ventricular Rate:  90 PR Interval:  172 QRS Duration: 113 QT Interval:  375 QTC Calculation: 459 R Axis:   33 Text Interpretation:  Sinus rhythm Borderline intraventricular conduction  delay Low voltage, precordial leads Borderline T abnormalities, diffuse  leads Confirmed by [facility] MD, Corene Cornea [phone]) on 02/05/2016 6:42:28 PM      MDM  On arrival the patient is in respiratory distress. Physical exam significant for diffuse bilateral wheezes as well as stridulous sounds coming from upper airway she had slight improvement in her symptoms in route with nebulized albuterol and Atrovent. Given significant stridulous aspect of her symptoms with diffuse wheezes he was given a dose of racemic epinephrine nebulized and then started on a continuous albuterol neb for 1 hour. She received steroids while in route by EMS. Gradually she significantly improved and after this treatment was able to speak in full sentences and had no further wheezing noted and she was then observed for about an hour after this  had no return of symptoms. Feel that this was an asthma exacerbation triggered from fumes of cleaning solutions. She was prescribed a burst of steroids for the next 4 days and was recommended to utilize her albuterol inhaler every 4-6 hours scheduled for the next day.  She was recommended to avoid further exposure to the cleaning supplies without adequate ventilation. She was recommended to follow up closely with her PCP in the next few days to discuss her asthma exacerbation symptoms and to further discuss her chronic chest wall tenderness and pain medication regimen. This plan  was discussed with the patient at the bedside and she stated with understanding and agreement. Return precautions were given.   Final diagnoses:  Asthma exacerbation  Chest wall tenderness        Zenovia Jarred, DO 02/05/16 2129  Zenovia Jarred, DO 02/05/16 2201  Merrily Pew, MD 02/05/16 2314

## 2016-02-05 NOTE — ED Notes (Signed)
Patient verbalized understanding of discharge instructions and denies any further needs or questions at this time. VS stable. Patient ambulatory with steady gait.  

## 2016-02-05 NOTE — ED Notes (Signed)
Gave EKG to Dr. Dayna Barker at [phone]

## 2016-02-05 NOTE — Discharge Instructions (Signed)

## 2016-02-05 NOTE — ED Notes (Signed)
Patient states "I feel a lot better, I'm ready to go." MD made aware.

## 2016-02-10 ENCOUNTER — Ambulatory Visit: Payer: Medicare Other | Admitting: Family Medicine

## 2016-03-31 ENCOUNTER — Encounter (HOSPITAL_COMMUNITY): Payer: Self-pay | Admitting: Emergency Medicine

## 2016-03-31 ENCOUNTER — Emergency Department (HOSPITAL_COMMUNITY)
Admission: EM | Admit: 2016-03-31 | Discharge: 2016-03-31 | Disposition: A | Discharge status: Home / Self Care | Payer: Medicare Other | Attending: Emergency Medicine | Admitting: Emergency Medicine

## 2016-03-31 DIAGNOSIS — Z7901 Long term (current) use of anticoagulants: Secondary | ICD-10-CM | POA: Insufficient documentation

## 2016-03-31 DIAGNOSIS — Z7982 Long term (current) use of aspirin: Secondary | ICD-10-CM | POA: Diagnosis not present

## 2016-03-31 DIAGNOSIS — Z79899 Other long term (current) drug therapy: Secondary | ICD-10-CM | POA: Diagnosis not present

## 2016-03-31 DIAGNOSIS — E669 Obesity, unspecified: Secondary | ICD-10-CM | POA: Diagnosis not present

## 2016-03-31 DIAGNOSIS — Z86718 Personal history of other venous thrombosis and embolism: Secondary | ICD-10-CM | POA: Diagnosis not present

## 2016-03-31 DIAGNOSIS — M79651 Pain in right thigh: Secondary | ICD-10-CM | POA: Insufficient documentation

## 2016-03-31 DIAGNOSIS — M25551 Pain in right hip: Secondary | ICD-10-CM | POA: Diagnosis not present

## 2016-03-31 DIAGNOSIS — Z8679 Personal history of other diseases of the circulatory system: Secondary | ICD-10-CM | POA: Diagnosis not present

## 2016-03-31 DIAGNOSIS — Z7951 Long term (current) use of inhaled steroids: Secondary | ICD-10-CM | POA: Diagnosis not present

## 2016-03-31 DIAGNOSIS — Z8673 Personal history of transient ischemic attack (TIA), and cerebral infarction without residual deficits: Secondary | ICD-10-CM | POA: Insufficient documentation

## 2016-03-31 DIAGNOSIS — M79652 Pain in left thigh: Secondary | ICD-10-CM | POA: Diagnosis not present

## 2016-03-31 DIAGNOSIS — Z760 Encounter for issue of repeat prescription: Secondary | ICD-10-CM | POA: Diagnosis present

## 2016-03-31 DIAGNOSIS — Z86711 Personal history of pulmonary embolism: Secondary | ICD-10-CM | POA: Diagnosis not present

## 2016-03-31 DIAGNOSIS — F17210 Nicotine dependence, cigarettes, uncomplicated: Secondary | ICD-10-CM | POA: Diagnosis not present

## 2016-03-31 DIAGNOSIS — B029 Zoster without complications: Secondary | ICD-10-CM | POA: Diagnosis not present

## 2016-03-31 DIAGNOSIS — M797 Fibromyalgia: Secondary | ICD-10-CM | POA: Diagnosis not present

## 2016-03-31 DIAGNOSIS — M25552 Pain in left hip: Secondary | ICD-10-CM | POA: Diagnosis not present

## 2016-03-31 MED ORDER — HYDROCODONE-ACETAMINOPHEN 5-325 MG PO TABS
1.0000 | ORAL_TABLET | Freq: Once | ORAL | Status: AC
  Administered 2016-03-31: 1 via ORAL
  Filled 2016-03-31: qty 1

## 2016-03-31 NOTE — ED Notes (Signed)
Pt arrives via POV from home. REquests refill of her tylenol #3. States tried to call her MD for refill but not in the office today. Pt states she takes the medication for shingles and fibromyalgia. Pt also has hx of gastric bypass unable to take NSAIDS. Pt awake, alert, oriented x4, NAD at present.

## 2016-03-31 NOTE — Discharge Instructions (Signed)
Please follow up with your primary care provider for further evaluation and for medication refill.

## 2016-03-31 NOTE — ED Provider Notes (Signed)
CSN: [id number]     Arrival date & time 03/31/16  1136 History  By signing my name below, I, Caitlin Houston, attest that this documentation has been prepared under the direction and in the presence of Domenic Moras, PA-C. Electronically Signed: Rayna Houston, ED Scribe. 03/31/2016. 1:03 PM.   Chief Complaint  Patient presents with  . Medication Refill   The history is provided by the patient. No language interpreter was used.    HPI Comments: Caitlin Houston is a 40 y.o. female who presents to the Emergency Department requesting a refill of Tylenol #3 which she is taking for fibromyalgia and shingles. She has been taking Tylenol #3 for 1 month. Her last dose was 6 days ago. She is additionally taking naproxen and flexeril which she has not run out of. Pt states that her prescribing physician is out of town and was told by the receptionist that she needed to come to the ED for a medication refill. She reports constant, 7/10, diffuse, bilateral leg pain. Pt denies any other associated symptoms at this time.   Past Medical History  Diagnosis Date  . Lupus ([place])   . Pulmonary embolism ([place])   . DVT (deep venous thrombosis) ([place])   . Atrial fibrillation ([place])   . TIA (transient ischemic attack)   . DDD (degenerative disc disease), lumbar    Past Surgical History  Procedure Laterality Date  . Gastric bypass     No family history on file. Social History  Substance Use Topics  . Smoking status: Current Every Day Smoker    Types: Cigarettes  . Smokeless tobacco: Not on file  . Alcohol Use: No   OB History    No data available     Review of Systems  Constitutional: Negative for fever and chills.  Musculoskeletal: Positive for myalgias.  Skin: Negative for wound.    Allergies  Coconut flavor and Other  Home Medications   Prior to Admission medications   Medication Sig Start Date End Date Taking? Authorizing Provider  aspirin 81 MG tablet Take 1 tablet (81 mg total) by mouth 3  (three) times daily. Patient taking differently: Take 81 mg by mouth daily after supper.  05/31/15   Montine Circle, PA-C  butalbital-acetaminophen-caffeine (FIORICET, ESGIC) [phone] MG per tablet Take 1 tablet by mouth every 6 (six) hours as needed for headache or migraine. 05/31/15   Montine Circle, PA-C  clotrimazole (LOTRIMIN) 1 % external solution Apply 1 application topically at bedtime. 05/31/15   Montine Circle, PA-C  Fluticasone-Salmeterol (ADVAIR) 250-50 MCG/DOSE AEPB Inhale 1 puff into the lungs 2 (two) times daily.    Historical Provider, MD  hydroxychloroquine (PLAQUENIL) 200 MG tablet Take 1 tablet (200 mg total) by mouth 3 (three) times daily. Patient taking differently: Take 200 mg by mouth 2 (two) times daily.  05/31/15   Montine Circle, PA-C  loratadine (CLARITIN) 10 MG tablet Take 10 mg by mouth daily with lunch.    Historical Provider, MD  meclizine (ANTIVERT) 12.5 MG tablet Take 1 tablet (12.5 mg total) by mouth 2 (two) times daily. Patient taking differently: Take 12.5 mg by mouth daily.  05/31/15   Montine Circle, PA-C  oxyCODONE-acetaminophen (PERCOCET/ROXICET) 5-325 MG tablet Take 2 tablets by mouth every 4 (four) hours as needed for severe pain. 11/21/15   Hanna Patel-Mills, PA-C  rivaroxaban (XARELTO) 20 MG TABS tablet Take 1 tablet (20 mg total) by mouth daily. Patient taking differently: Take 20 mg by mouth daily with supper.  05/31/15  Montine Circle, PA-C   BP 122/71 mmHg  Pulse 70  Temp(Src) 98.7 F (37.1 C) (Oral)  Resp 18  Ht 5\' 6"  (1.676 m)  Wt 289 lb 3 oz (131.175 kg)  BMI 46.70 kg/m2  SpO2 100%    Physical Exam  Constitutional: She is oriented to person, place, and time. She appears well-developed. No distress.  Obese African American female sitting upright, speaking in complete sentences and in NAD  HENT:  Head: Normocephalic and atraumatic.  Eyes: EOM are normal.  Neck: Normal range of motion.  Cardiovascular: Normal rate and intact distal pulses.    Pulmonary/Chest: Effort normal. No respiratory distress.  Abdominal: Soft.  Musculoskeletal: Normal range of motion. She exhibits tenderness (mild diffuse tendernss to bilateral hips and thigh, no overlying skin changes.  FROM to major joints in BLE).  Neurological: She is alert and oriented to person, place, and time.  Skin: Skin is warm and dry.  Psychiatric: She has a normal mood and affect.  Nursing note and vitals reviewed.   ED Course  Procedures  DIAGNOSTIC STUDIES: Oxygen Saturation is 100% on RA, normal by my interpretation.    COORDINATION OF CARE: 12:27 PM Pt presents requesting a refill of her rx Tylenol #3. Discussed next steps with pt including giving pt one dose of pain medication in the ER but no narcotic prescription given. Pt was made aware of policies regarding rx of narcotic medications in the ED. Pt verbalized understanding and is agreeable with the plan.    MDM   Final diagnoses:  Encounter for medication refill    BP 122/71 mmHg  Pulse 70  Temp(Src) 98.7 F (37.1 C) (Oral)  Resp 18  Ht 5\' 6"  (1.676 m)  Wt 131.175 kg  BMI 46.70 kg/m2  SpO2 100%  LMP 02/09/2016 (Approximate)  I personally performed the services described in this documentation, which was scribed in my presence. The recorded information has been reviewed and is accurate.      Domenic Moras, PA-C 03/31/16 [place], MD 04/01/16 [phone]

## 2016-06-14 ENCOUNTER — Emergency Department (HOSPITAL_COMMUNITY): Payer: Medicare Other

## 2016-06-14 ENCOUNTER — Encounter (HOSPITAL_COMMUNITY): Payer: Self-pay | Admitting: Emergency Medicine

## 2016-06-14 ENCOUNTER — Emergency Department (HOSPITAL_COMMUNITY)
Admission: EM | Admit: 2016-06-14 | Discharge: 2016-06-15 | Disposition: A | Discharge status: Home / Self Care | Payer: Medicare Other | Attending: Emergency Medicine | Admitting: Emergency Medicine

## 2016-06-14 DIAGNOSIS — R0789 Other chest pain: Secondary | ICD-10-CM | POA: Diagnosis present

## 2016-06-14 DIAGNOSIS — Z7982 Long term (current) use of aspirin: Secondary | ICD-10-CM | POA: Diagnosis not present

## 2016-06-14 DIAGNOSIS — Z7901 Long term (current) use of anticoagulants: Secondary | ICD-10-CM | POA: Diagnosis not present

## 2016-06-14 DIAGNOSIS — F419 Anxiety disorder, unspecified: Secondary | ICD-10-CM | POA: Insufficient documentation

## 2016-06-14 DIAGNOSIS — F17210 Nicotine dependence, cigarettes, uncomplicated: Secondary | ICD-10-CM | POA: Insufficient documentation

## 2016-06-14 DIAGNOSIS — Z8673 Personal history of transient ischemic attack (TIA), and cerebral infarction without residual deficits: Secondary | ICD-10-CM | POA: Diagnosis not present

## 2016-06-14 LAB — I-STAT CHEM 8, ED
BUN: 11 mg/dL (ref 6–20)
CREATININE: 0.7 mg/dL (ref 0.44–1.00)
Calcium, Ion: 1.13 mmol/L (ref 1.13–1.30)
Chloride: 104 mmol/L (ref 101–111)
Glucose, Bld: 80 mg/dL (ref 65–99)
HEMATOCRIT: 34 % — AB (ref 36.0–46.0)
Hemoglobin: 11.6 g/dL — ABNORMAL LOW (ref 12.0–15.0)
POTASSIUM: 4.2 mmol/L (ref 3.5–5.1)
Sodium: 139 mmol/L (ref 135–145)
TCO2: 24 mmol/L (ref 0–100)

## 2016-06-14 LAB — CBC WITH DIFFERENTIAL/PLATELET
BASOS ABS: 0 10*3/uL (ref 0.0–0.1)
BASOS PCT: 1 %
Eosinophils Absolute: 0 10*3/uL (ref 0.0–0.7)
Eosinophils Relative: 1 %
HEMATOCRIT: 30.6 % — AB (ref 36.0–46.0)
HEMOGLOBIN: 10.1 g/dL — AB (ref 12.0–15.0)
Lymphocytes Relative: 31 %
Lymphs Abs: 1.3 10*3/uL (ref 0.7–4.0)
MCH: 30.8 pg (ref 26.0–34.0)
MCHC: 33 g/dL (ref 30.0–36.0)
MCV: 93.3 fL (ref 78.0–100.0)
MONOS PCT: 8 %
Monocytes Absolute: 0.3 10*3/uL (ref 0.1–1.0)
NEUTROS ABS: 2.5 10*3/uL (ref 1.7–7.7)
NEUTROS PCT: 61 %
Platelets: 249 10*3/uL (ref 150–400)
RBC: 3.28 MIL/uL — AB (ref 3.87–5.11)
RDW: 13.7 % (ref 11.5–15.5)
WBC: 4.1 10*3/uL (ref 4.0–10.5)

## 2016-06-14 LAB — I-STAT BETA HCG BLOOD, ED (MC, WL, AP ONLY): I-stat hCG, quantitative: <5 m[IU]/mL (ref <5)

## 2016-06-14 LAB — I-STAT TROPONIN, ED: TROPONIN I, POC: 0 ng/mL (ref 0.00–0.08)

## 2016-06-14 MED ORDER — IOPAMIDOL (ISOVUE-370) INJECTION 76%
INTRAVENOUS | Status: AC
  Administered 2016-06-14: 80 mL
  Filled 2016-06-14: qty 100

## 2016-06-14 MED ORDER — KETOROLAC TROMETHAMINE 30 MG/ML IJ SOLN
30.0000 mg | Freq: Once | INTRAMUSCULAR | Status: AC
  Administered 2016-06-14: 30 mg via INTRAVENOUS
  Filled 2016-06-14: qty 1

## 2016-06-14 MED ORDER — SODIUM CHLORIDE 0.9 % IV SOLN
Freq: Once | INTRAVENOUS | Status: AC
  Administered 2016-06-14: 23:00:00 via INTRAVENOUS

## 2016-06-14 NOTE — ED Triage Notes (Signed)
EMS states patient called for chest pain. EMS arrives to home, patient sitting in chair in kitchen. EMS states patient was pressuring her speech and talking in choppy sentences. Patient stated to EMS she was "breathing through the pain." Reported from EMS patient through head back in chair, and was "unresponsive." EMS states they tried to pull her forward and she was "pushing against them" patient opened eyes and stated "I don't want to die" (frantically)  324 ASA, 2 Nitrol, 20 g LAC, "Sharp, stabbing, shooting 10/10 pain" Patient arrives to ED alert and oriented x 4. 4/10 chest pain. 115/73, 80 HR, 100 % room air, resp 20.

## 2016-06-14 NOTE — ED Notes (Signed)
Patient transported to CT at this time via ED stretcher. Pt in no apparent distress at this time.   

## 2016-06-14 NOTE — ED Provider Notes (Signed)
[facility] DEPT Provider Note   CSN: [id number] Arrival date & time: 06/14/16  2016     History   Chief Complaint Chief Complaint  Patient presents with  . Chest Pain    HPI Caitlin Houston is a 40 y.o. female.  This a 40 year old female with multiple medical problems who has had a week to 10 days of bilateral epigastric chest pain that waxes and wanes in intensity but never goes away.  Despite the use of Naprosyn and tramadol and Tylenol. She does have a significant history of PE, stating that she's had 15 PEs despite the use of anticoagulants, and a [place] filter.  Her last known PE was in 2010.  She's had multiple DVTs since that time. She states she is compliant with her medications.  She has not contacted her physician in the past 10 days until today when she made an appointment to be seen in a week.  Her physician instructed her that if she got worse or was concerned to go to the emergency department for evaluation. EMS was called to the house.  They did give a baby aspirin as well as 2 sublingual nitros each time she received a sublingual nitroglycerin.  She stated the pain was significantly better.  She states that she has had a heart attack in the past but is unsure exactly when and does not see a cardiologist on a regular basis. She also reports that she is under a great deal of stress for the past 2-3 weeks as she is being evicted from her apartment and she is fighting this and every time she speaks with her returning her pain gets worse.      Past Medical History:  Diagnosis Date  . Atrial fibrillation ([place])   . DDD (degenerative disc disease), lumbar   . DVT (deep venous thrombosis) ([place])   . Lupus ([place])   . Pulmonary embolism ([place])   . TIA (transient ischemic attack)     There are no active problems to display for this patient.   Past Surgical History:  Procedure Laterality Date  . GASTRIC BYPASS      OB History    No data available        Home Medications    Prior to Admission medications   Medication Sig Start Date End Date Taking? Authorizing Provider  aspirin 81 MG tablet Take 1 tablet (81 mg total) by mouth 3 (three) times daily. Patient taking differently: Take 81 mg by mouth daily after supper.  05/31/15   Montine Circle, PA-C  butalbital-acetaminophen-caffeine (FIORICET, ESGIC) [phone] MG per tablet Take 1 tablet by mouth every 6 (six) hours as needed for headache or migraine. 05/31/15   Montine Circle, PA-C  clotrimazole (LOTRIMIN) 1 % external solution Apply 1 application topically at bedtime. 05/31/15   Montine Circle, PA-C  Fluticasone-Salmeterol (ADVAIR) 250-50 MCG/DOSE AEPB Inhale 1 puff into the lungs 2 (two) times daily.    Historical Provider, MD  hydroxychloroquine (PLAQUENIL) 200 MG tablet Take 1 tablet (200 mg total) by mouth 3 (three) times daily. Patient taking differently: Take 200 mg by mouth 2 (two) times daily.  05/31/15   Montine Circle, PA-C  hydrOXYzine (ATARAX/VISTARIL) 25 MG tablet Take 1 tablet (25 mg total) by mouth every 6 (six) hours. 06/15/16   Junius Creamer, NP  loratadine (CLARITIN) 10 MG tablet Take 10 mg by mouth daily with lunch.    Historical Provider, MD  meclizine (ANTIVERT) 12.5 MG tablet Take 1 tablet (12.5 mg total) by  mouth 2 (two) times daily. Patient taking differently: Take 12.5 mg by mouth daily.  05/31/15   Montine Circle, PA-C  oxyCODONE-acetaminophen (PERCOCET/ROXICET) 5-325 MG tablet Take 2 tablets by mouth every 4 (four) hours as needed for severe pain. 11/21/15   Hanna Patel-Mills, PA-C  rivaroxaban (XARELTO) 20 MG TABS tablet Take 1 tablet (20 mg total) by mouth daily. Patient taking differently: Take 20 mg by mouth daily with supper.  05/31/15   Montine Circle, PA-C    Family History No family history on file.  Social History Social History  Substance Use Topics  . Smoking status: Current Every Day Smoker    Types: Cigarettes  . Smokeless tobacco: Current User  .  Alcohol use No     Allergies   Coconut flavor and Other   Review of Systems Review of Systems  Constitutional: Negative for chills and fever.  Respiratory: Positive for shortness of breath.   Cardiovascular: Positive for chest pain. Negative for leg swelling.  Psychiatric/Behavioral: The patient is nervous/anxious.   All other systems reviewed and are negative.    Physical Exam Updated Vital Signs BP 113/63   Pulse 85   Resp 18   SpO2 100%   Physical Exam  Constitutional: She appears well-developed and well-nourished.  HENT:  Head: Normocephalic.  Mouth/Throat: Oropharynx is clear and moist.  Eyes: Pupils are equal, round, and reactive to light.  Neck: Normal range of motion.  Cardiovascular: Normal rate and regular rhythm.   Pulmonary/Chest: Effort normal and breath sounds normal. No respiratory distress. She has no wheezes.  Patient does not have any wheezing, rales or rhonchi.  On examination, but she is visually short of breath when speaking.  Abdominal: Soft. She exhibits no distension.  Musculoskeletal: Normal range of motion.  Neurological: She is alert.  Skin: Skin is warm.  Psychiatric: She has a normal mood and affect.  Nursing note and vitals reviewed.    ED Treatments / Results  Labs (all labs ordered are listed, but only abnormal results are displayed) Labs Reviewed  CBC WITH DIFFERENTIAL/PLATELET - Abnormal; Notable for the following:       Result Value   RBC 3.28 (*)    Hemoglobin 10.1 (*)    HCT 30.6 (*)    All other components within normal limits  I-STAT CHEM 8, ED - Abnormal; Notable for the following:    Hemoglobin 11.6 (*)    HCT 34.0 (*)    All other components within normal limits  I-STAT BETA HCG BLOOD, ED ([facility], [facility], [facility] ONLY)  I-STAT TROPOININ, ED    EKG  EKG Interpretation  Date/Time:  Wednesday June 14 2016 20:19:12 EDT Ventricular Rate:  74 PR Interval:  172 QRS Duration: 118 QT Interval:  392 QTC Calculation: 435 R  Axis:   63 Text Interpretation:  Normal sinus rhythm Incomplete right bundle branch block Nonspecific T wave abnormality Abnormal ECG Confirmed by [facility] MD, PEDRO ([id number]) on 06/14/2016 9:02:12 PM       Radiology Ct Angio Chest Pe W And/or Wo Contrast  Result Date: 06/14/2016 CLINICAL DATA:  Initial evaluation for acute shortness of breath, chest pain. History of lupus and multiple PEs. EXAM: CT ANGIOGRAPHY CHEST WITH CONTRAST TECHNIQUE: Multidetector CT imaging of the chest was performed using the standard protocol during bolus administration of intravenous contrast. Multiplanar CT image reconstructions and MIPs were obtained to evaluate the vascular anatomy. CONTRAST:  80 cc of Isovue 370. COMPARISON:  Prior radiograph from 02/05/2016. FINDINGS: Partially visualized thyroid within  normal limits. No pathologically enlarged mediastinal, hilar, or axillary lymph nodes identified. Intrathoracic aorta of normal caliber and appearance without acute abnormality. Great vessels within normal limits. Heart size normal.  No pericardial effusion. Pulmonary arteries adequately opacified for evaluation, although evaluation for possible distal emboli limited due to body habitus. Main pulmonary artery measures within normal limits for diameter at 2.9 cm. No filling defect to suggest acute pulmonary embolism identified. Re-formatted imaging confirms these findings. Patchy and hazy bibasilar opacities most likely reflect atelectasis. No focal infiltrates identified. No pulmonary edema or pleural effusion. No pneumothorax. No worrisome pulmonary nodule or mass. Visualized portions of the upper abdomen demonstrate no acute abnormality. Sequela of prior gastric bypass noted. No acute osseous abnormality. No worrisome lytic or plastic osseous lesions. IMPRESSION: 1. No CT evidence for acute pulmonary embolism. 2. Mild dependent atelectatic changes within both lungs. No other active cardiopulmonary disease identified.  Electronically Signed   By: Jeannine Boga M.D.   On: 06/14/2016 22:46    Procedures Procedures (including critical care time)  Medications Ordered in ED Medications  hydrOXYzine (ATARAX/VISTARIL) tablet 25 mg (not administered)  0.9 %  sodium chloride infusion ( Intravenous New Bag/Given 06/14/16 2231)  iopamidol (ISOVUE-370) 76 % injection (80 mLs  Contrast Given 06/14/16 2208)  ketorolac (TORADOL) 30 MG/ML injection 30 mg (30 mg Intravenous Given 06/14/16 2329)     Initial Impression / Assessment and Plan / ED Course  I have reviewed the triage vital signs and the nursing notes.  Pertinent labs & imaging results that were available during my care of the patient were reviewed by me and considered in my medical decision making (see chart for details).  Clinical Course   Check labs, EKG, I will obtain a chest CT angiomata.  Due to patient's history and persistent symptoms, although I feel that this is more anxiety.    Final Clinical Impressions(s) / ED Diagnoses   Final diagnoses:  Atypical chest pain  Anxiety    New Prescriptions New Prescriptions   HYDROXYZINE (ATARAX/VISTARIL) 25 MG TABLET    Take 1 tablet (25 mg total) by mouth every 6 (six) hours.     Junius Creamer, NP 06/15/16 0023    Fatima Blank, MD 06/15/16 [id number]

## 2016-06-15 DIAGNOSIS — R0789 Other chest pain: Secondary | ICD-10-CM | POA: Diagnosis not present

## 2016-06-15 MED ORDER — HYDROXYZINE HCL 25 MG PO TABS: 25.0000 mg | ORAL_TABLET | Freq: Four times a day (QID) | ORAL | 0 refills | Status: AC

## 2016-06-15 MED ORDER — HYDROXYZINE HCL 25 MG PO TABS
25.0000 mg | ORAL_TABLET | Freq: Once | ORAL | Status: AC
  Administered 2016-06-15: 25 mg via ORAL
  Filled 2016-06-15: qty 1

## 2016-06-15 NOTE — Discharge Instructions (Signed)
That your evaluated for your chest pain with fortunately negative results.  Her EKG is unchanged.  Ertapenem in which is the enzyme your heart.  Produces when 18, stress is negative.  Year chest CT scan is negative for pulmonary embolus your systems are consistent with stress or anxiety.  I have given you a prescription for medication called Vistaril, you can take this 1 tablet every 6 hours as needed for stress/anxiety.  Please make an appointment with your primary care physician discuss the situation

## 2016-12-15 IMAGING — CR DG LUMBAR SPINE 2-3V
3 series · 3 of 3 positions shown · non-contrast
Comparison: Lumbar MRI September 06, 2015.

CLINICAL DATA: Lumbago with bilateral radicular symptoms following
fall

EXAM:
LUMBAR SPINE - 2-3 VIEW

[l-spine ap]
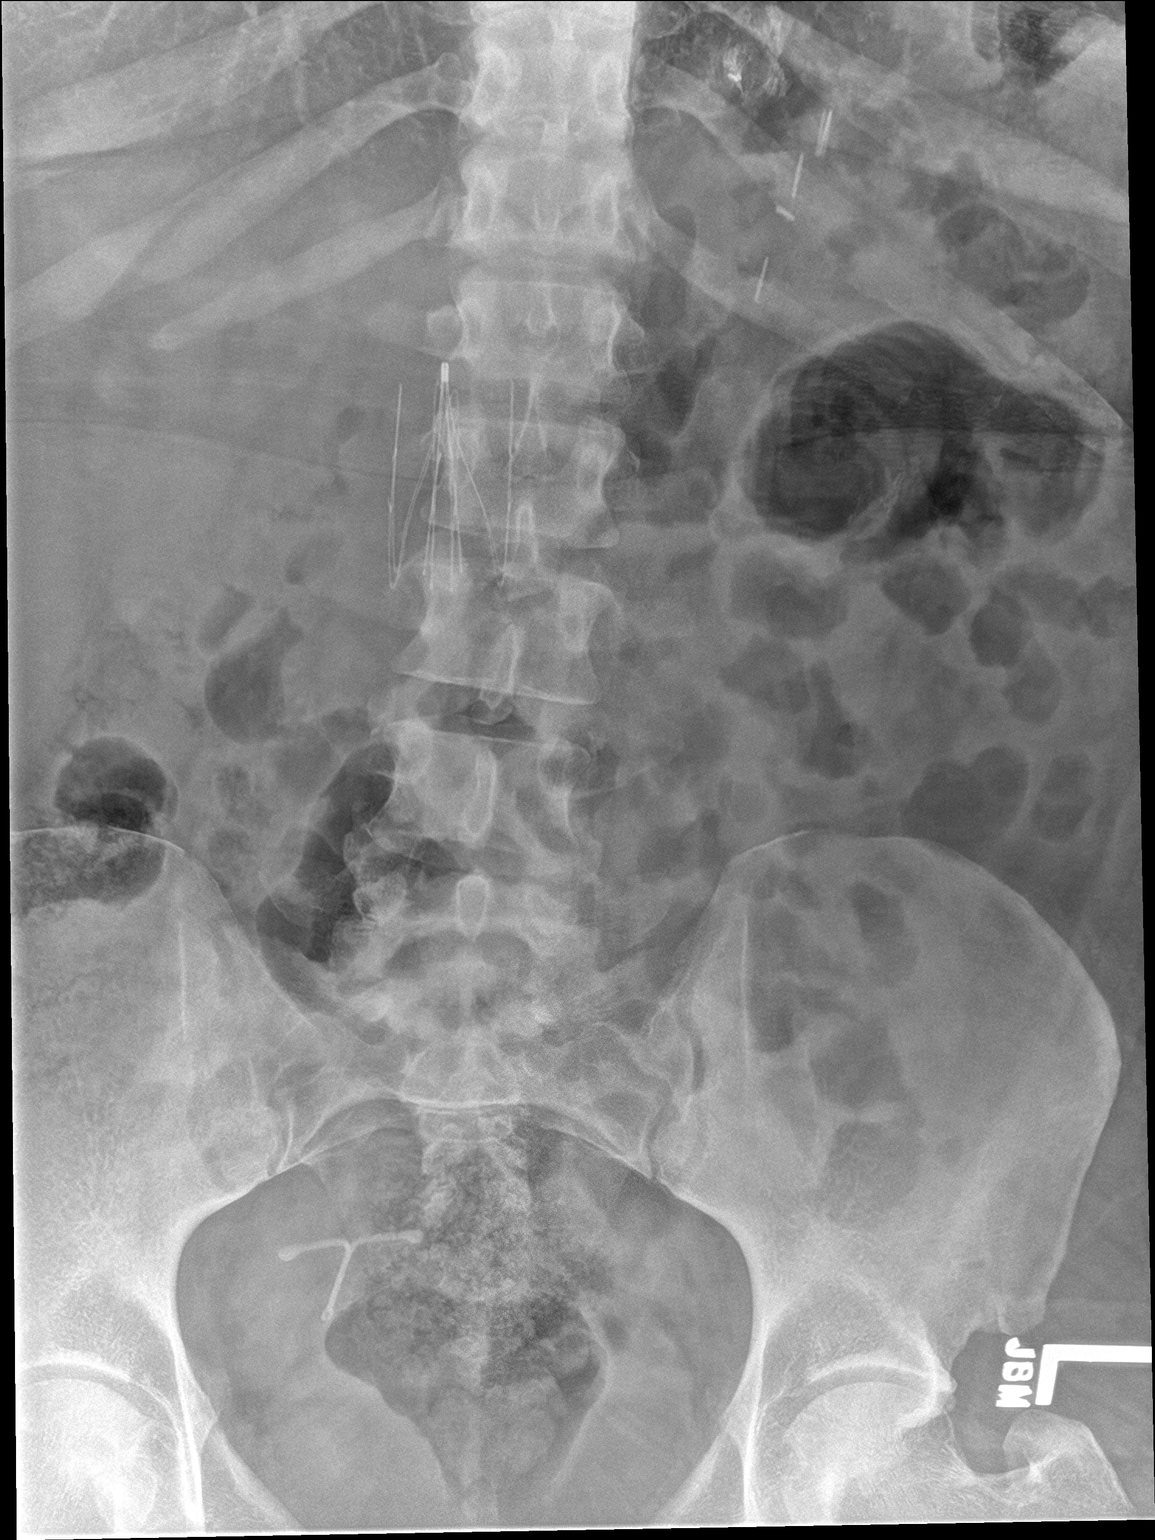

[l-spine lat]
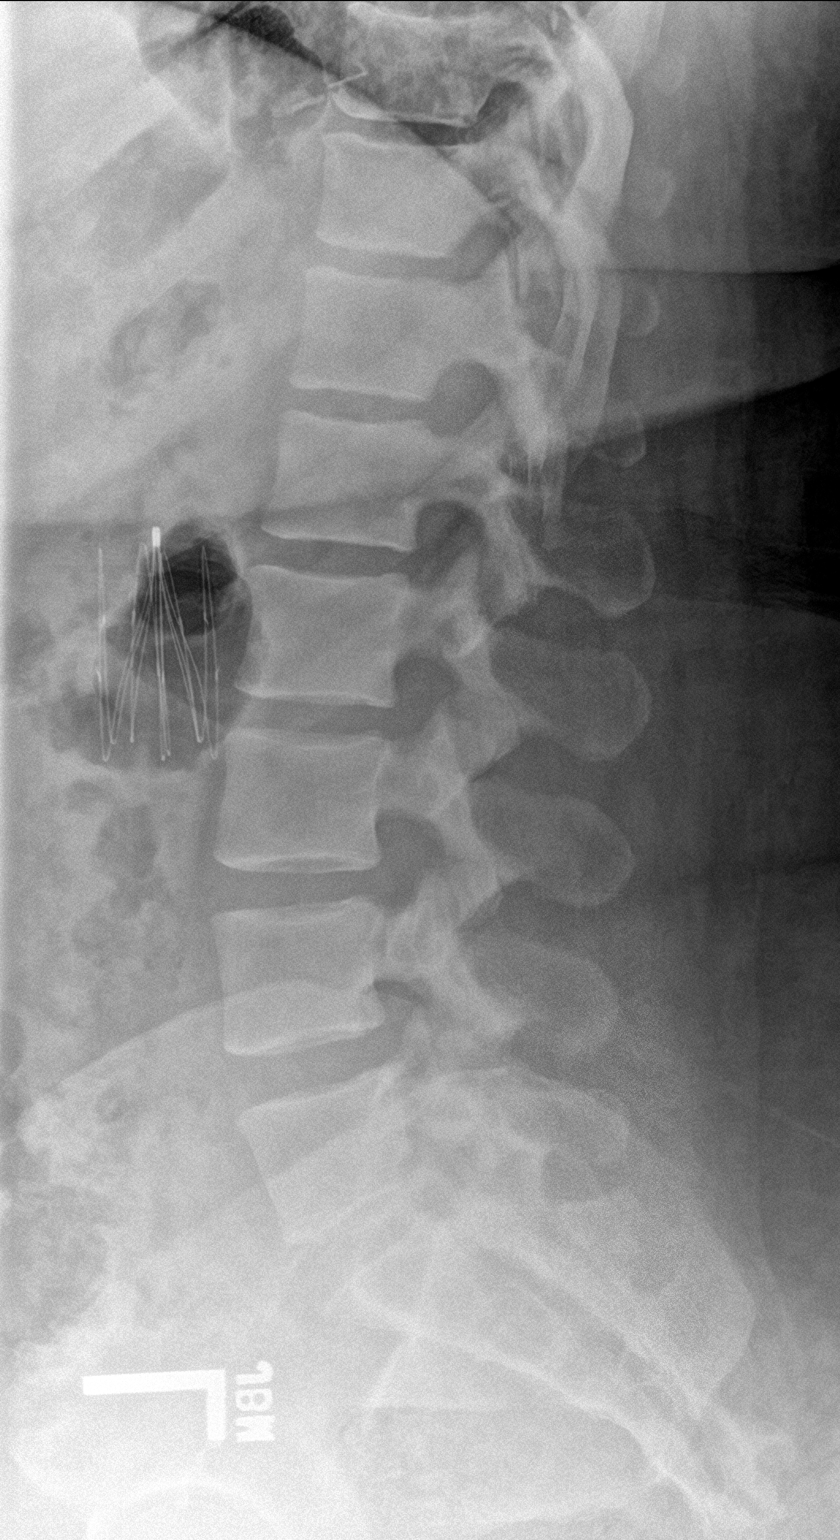

[l-spine spot]
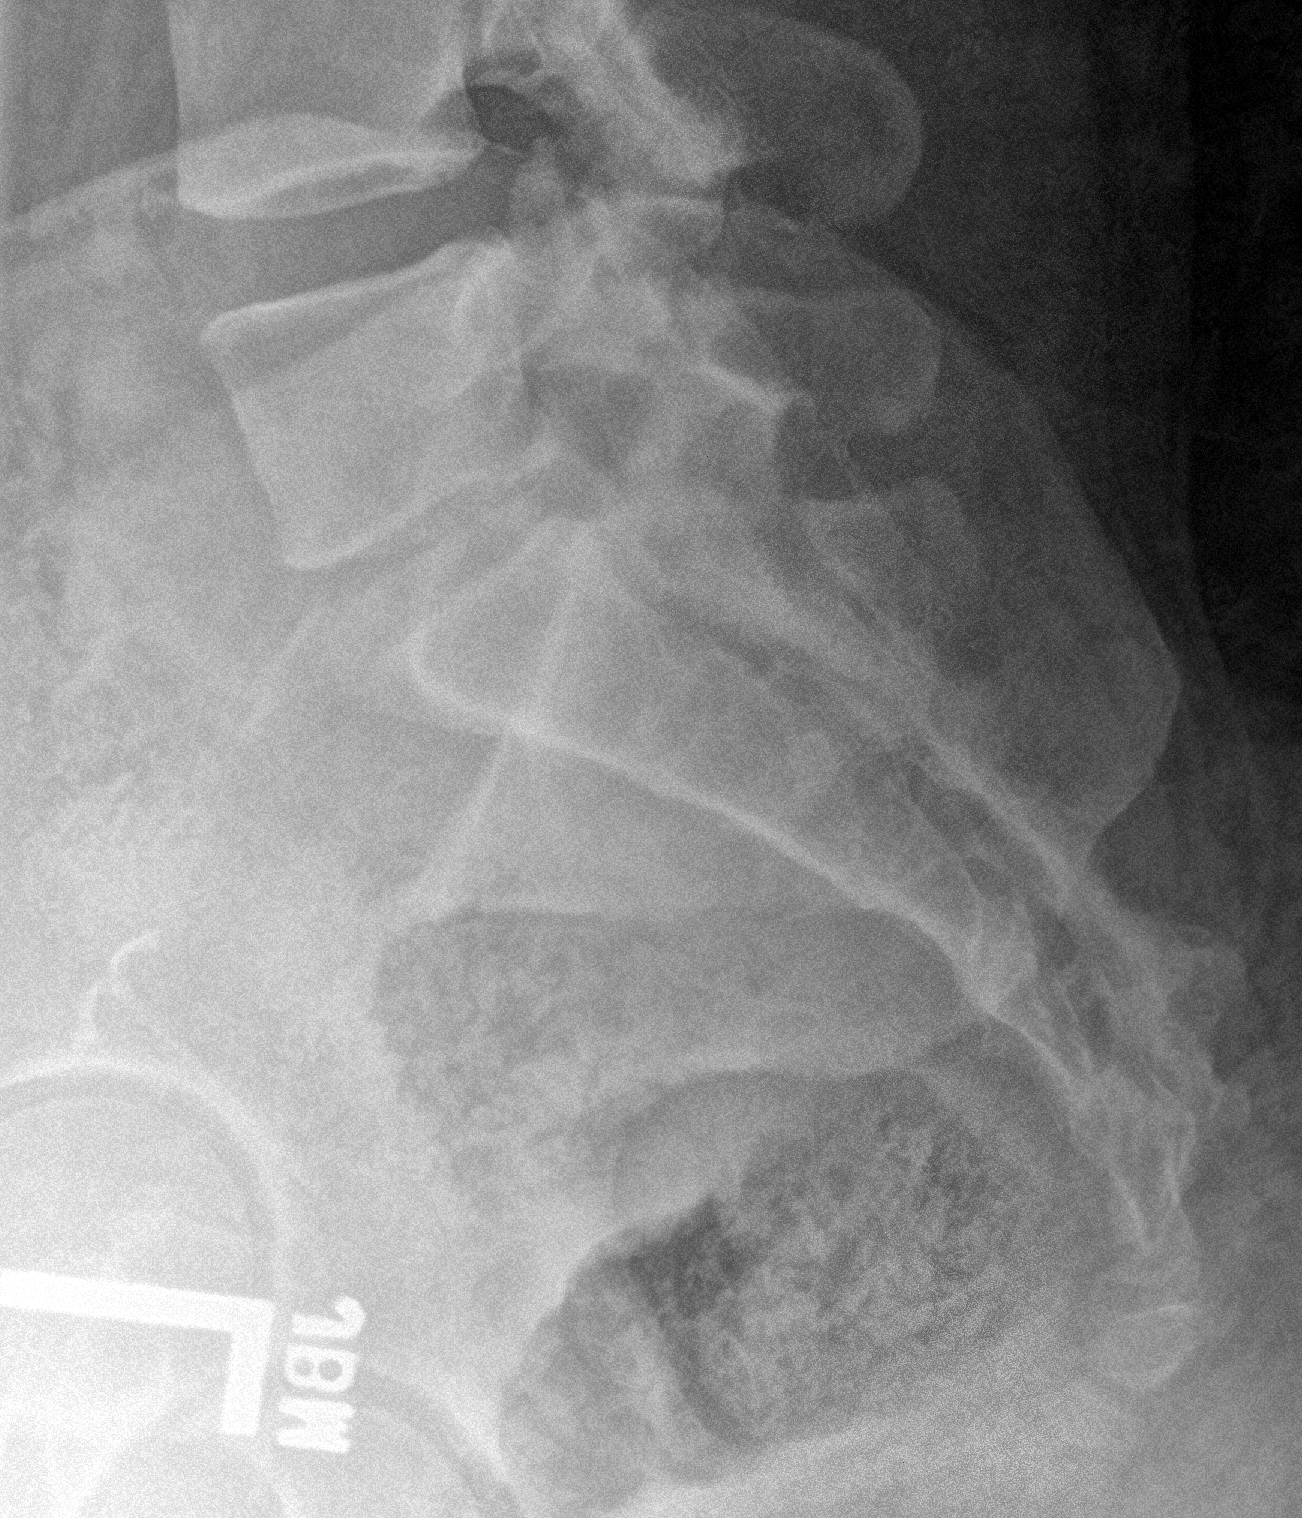

[3 of 3 positions shown; findings below may reference images not displayed]

FINDINGS: Frontal, lateral, and spot lumbosacral lateral images were obtained.
There are 5 non-rib-bearing lumbar type vertebral bodies. There is
no fracture or spondylolisthesis. Disc spaces appear intact. No
erosive change. There is a filter in the inferior vena cava centered
at the level of L2. There is an intrauterine device slightly to the
right of midline in the pelvis.
IMPRESSION: No fracture or spondylolisthesis.  No appreciable arthropathy.

## 2016-12-15 IMAGING — CR DG SACRUM/COCCYX 2+V
3 series · 3 of 3 positions shown · non-contrast
Comparison: None.

CLINICAL DATA: Patient's legs gave out and she fell onto the tail
bone this morning. Low back pain radiating to both legs.

EXAM:
SACRUM AND COCCYX - 2+ VIEW

[coccyx ap]
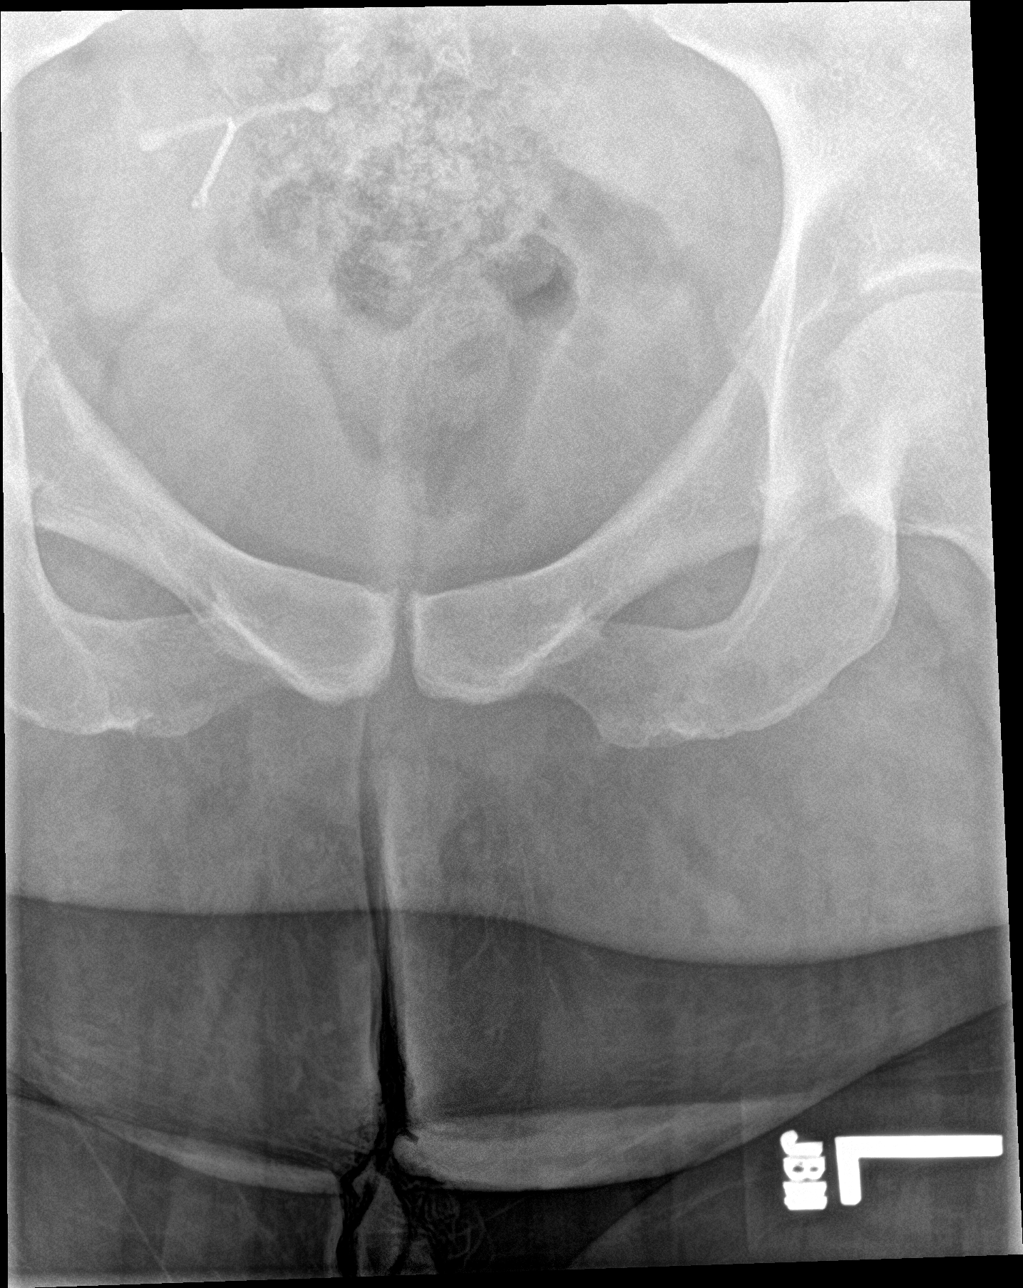

[sacrum ap]
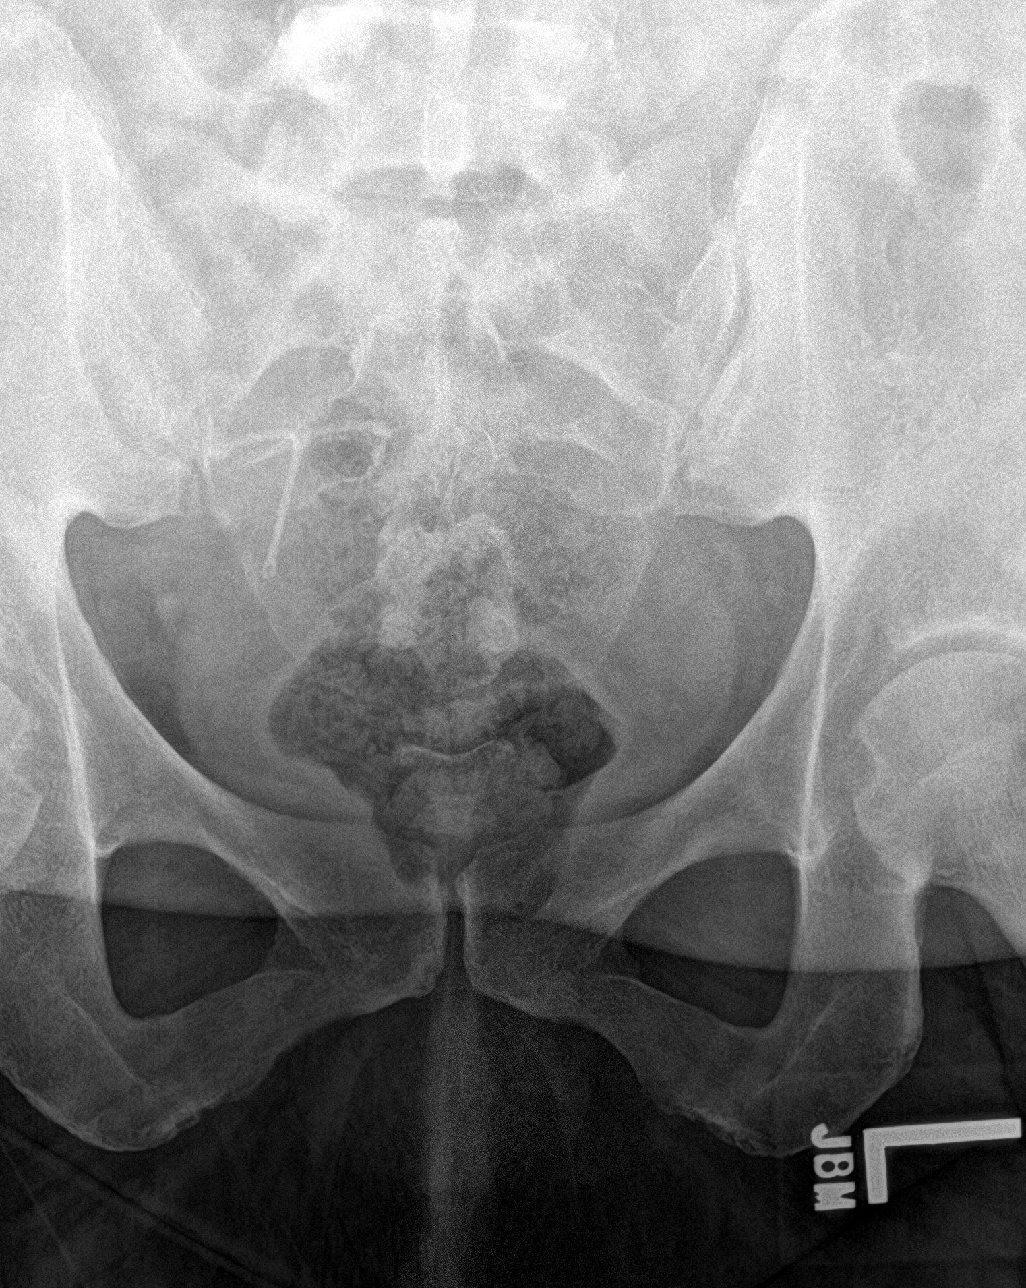

[sacrum lat]
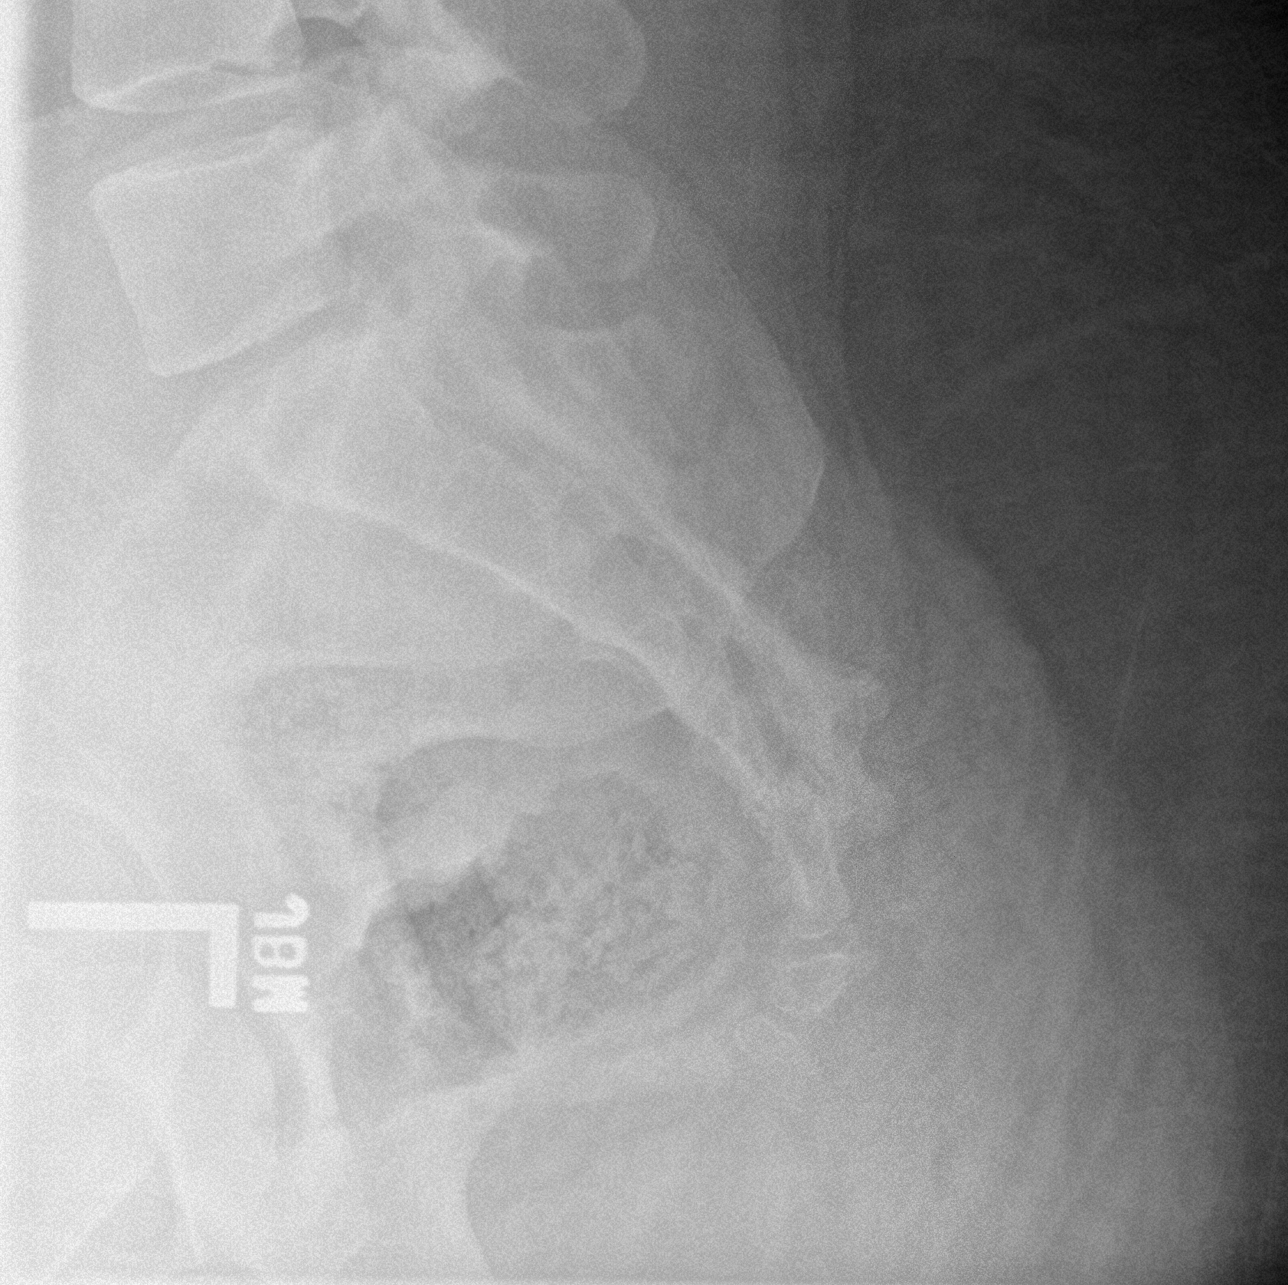

[3 of 3 positions shown; findings below may reference images not displayed]

FINDINGS: Sacrum is partially obscured by overlying stool in the rectum.
Alignment appears normal. Bone cortex appears intact. Sacral struts
and SI joints are symmetrical. No destructive bone lesions
identified. Intrauterine device..
IMPRESSION: No acute bony abnormalities.

## 2017-02-13 IMAGING — DX DG ANKLE COMPLETE 3+V*R*
3 series · 3 of 3 positions shown · non-contrast
Comparison: None.

CLINICAL DATA: Right lower extremity edema and pain.

EXAM:
RIGHT ANKLE - COMPLETE 3+ VIEW

[ankle ap]
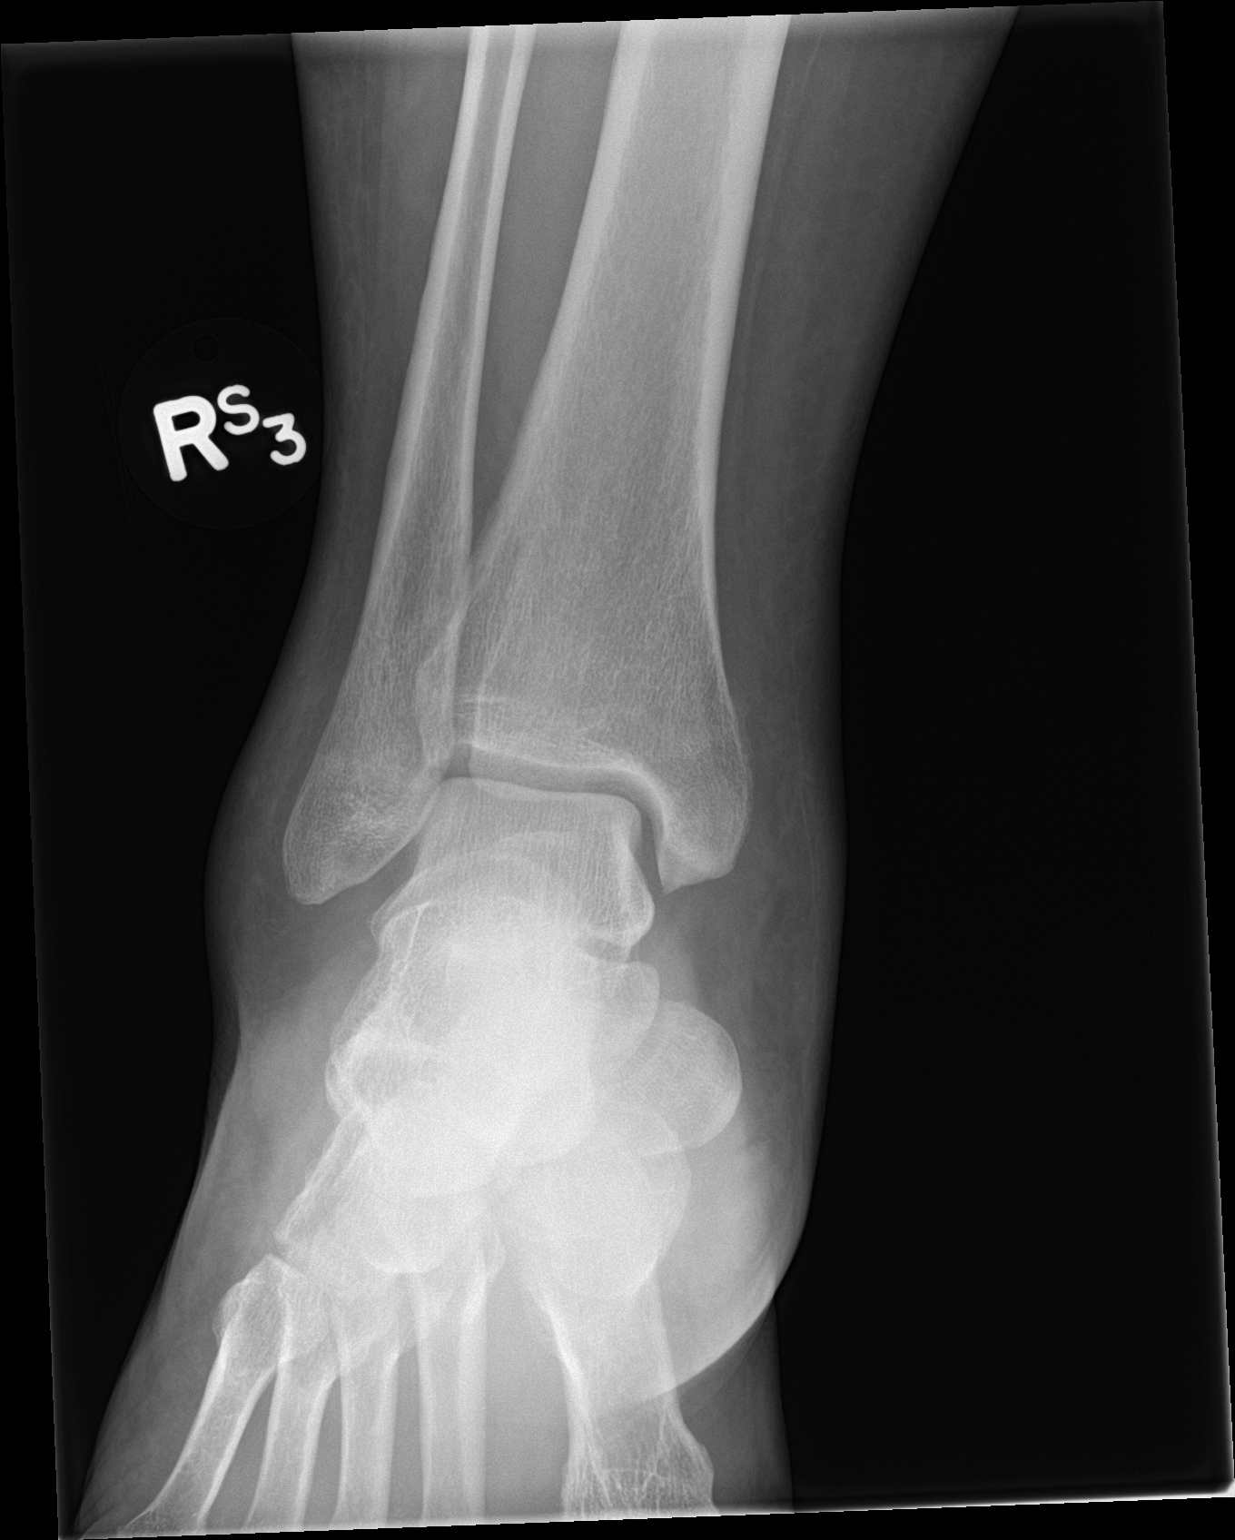

[ankle obl]
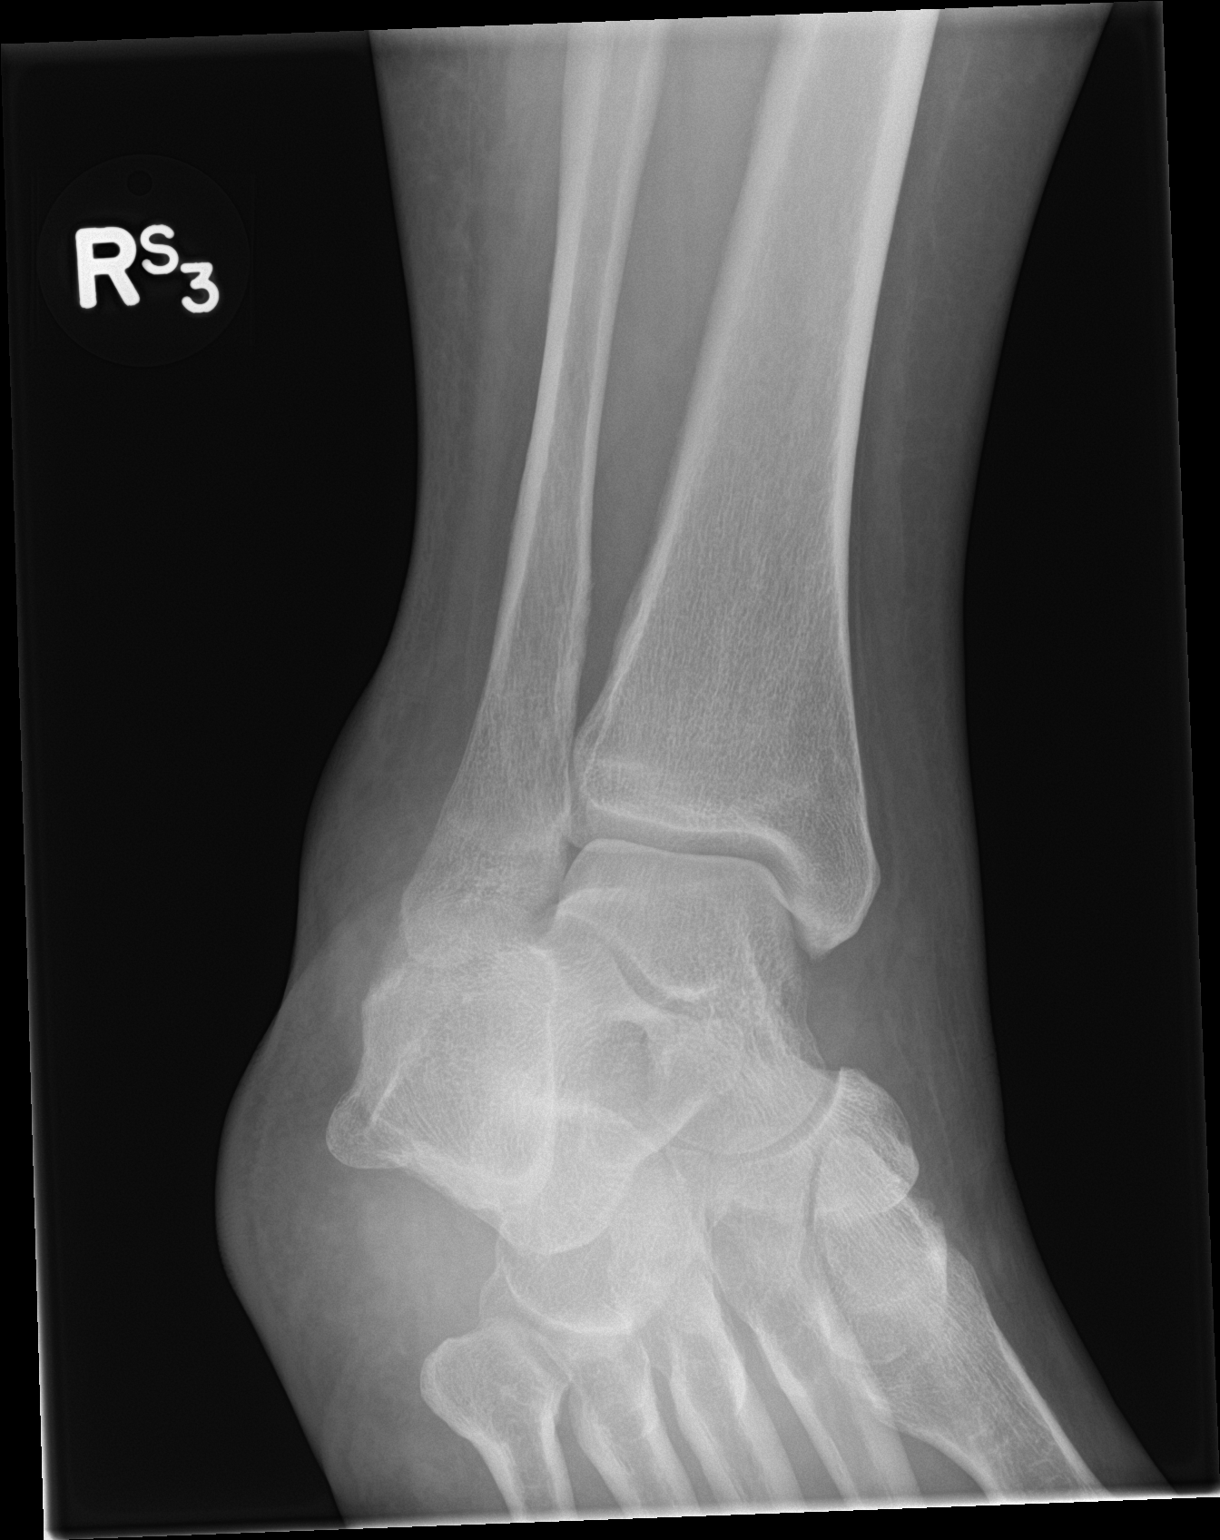

[ankle lat]
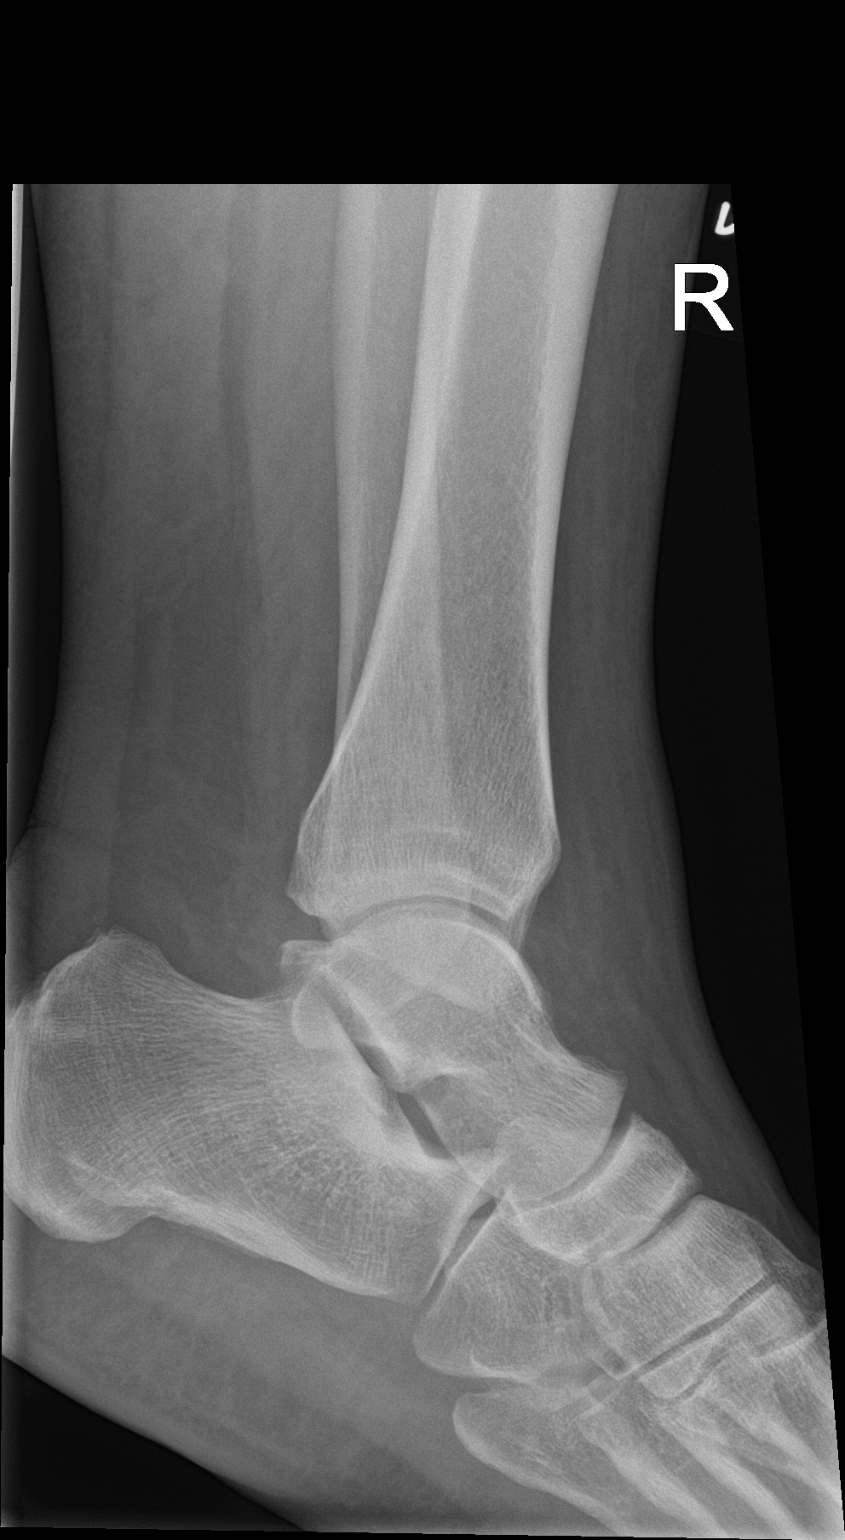

[3 of 3 positions shown; findings below may reference images not displayed]

FINDINGS: Soft tissue swelling is seen surrounding the ankle. No evidence of
fracture, dislocation, significant arthropathy or bony lesion.
IMPRESSION: Soft tissue swelling without evidence of underlying bony
abnormality.

## 2017-05-23 ENCOUNTER — Encounter (HOSPITAL_COMMUNITY): Payer: Self-pay | Admitting: *Deleted

## 2017-05-23 ENCOUNTER — Emergency Department (HOSPITAL_COMMUNITY)
Admission: EM | Admit: 2017-05-23 | Discharge: 2017-05-23 | Disposition: A | Discharge status: Home / Self Care | Payer: Medicare Other | Attending: Emergency Medicine | Admitting: Emergency Medicine

## 2017-05-23 DIAGNOSIS — F17210 Nicotine dependence, cigarettes, uncomplicated: Secondary | ICD-10-CM | POA: Diagnosis not present

## 2017-05-23 DIAGNOSIS — Z7982 Long term (current) use of aspirin: Secondary | ICD-10-CM | POA: Diagnosis not present

## 2017-05-23 DIAGNOSIS — Z7901 Long term (current) use of anticoagulants: Secondary | ICD-10-CM | POA: Insufficient documentation

## 2017-05-23 DIAGNOSIS — Z79899 Other long term (current) drug therapy: Secondary | ICD-10-CM | POA: Diagnosis not present

## 2017-05-23 DIAGNOSIS — M65061 Abscess of tendon sheath, right lower leg: Secondary | ICD-10-CM | POA: Diagnosis present

## 2017-05-23 DIAGNOSIS — Z8673 Personal history of transient ischemic attack (TIA), and cerebral infarction without residual deficits: Secondary | ICD-10-CM | POA: Diagnosis not present

## 2017-05-23 DIAGNOSIS — L03115 Cellulitis of right lower limb: Secondary | ICD-10-CM | POA: Diagnosis not present

## 2017-05-23 MED ORDER — NAPROXEN 500 MG PO TABS: 500.0000 mg | ORAL_TABLET | Freq: Two times a day (BID) | ORAL | 0 refills | Status: AC

## 2017-05-23 MED ORDER — SULFAMETHOXAZOLE-TRIMETHOPRIM 800-160 MG PO TABS
1.0000 | ORAL_TABLET | Freq: Once | ORAL | Status: AC
  Administered 2017-05-23: 1 via ORAL
  Filled 2017-05-23: qty 1

## 2017-05-23 MED ORDER — HYDROCODONE-ACETAMINOPHEN 5-325 MG PO TABS
1.0000 | ORAL_TABLET | Freq: Once | ORAL | Status: AC
  Administered 2017-05-23: 1 via ORAL
  Filled 2017-05-23: qty 1

## 2017-05-23 MED ORDER — SULFAMETHOXAZOLE-TRIMETHOPRIM 800-160 MG PO TABS: 1.0000 | ORAL_TABLET | Freq: Two times a day (BID) | ORAL | 0 refills | Status: AC

## 2017-05-23 MED ORDER — HYDROCODONE-ACETAMINOPHEN 5-325 MG PO TABS: ORAL_TABLET | ORAL | 0 refills | Status: AC

## 2017-05-23 NOTE — Discharge Instructions (Signed)
Please read and follow all provided instructions.  Your diagnoses today include:  1. Cellulitis of right lower extremity     Tests performed today include:  Vital signs. See below for your results today.   Medications prescribed:   Bactrim (trimethoprim/sulfamethoxazole) - antibiotic  You have been prescribed an antibiotic medicine: take the entire course of medicine even if you are feeling better. Stopping early can cause the antibiotic not to work.   Vicodin (hydrocodone/acetaminophen) - narcotic pain medication  DO NOT drive or perform any activities that require you to be awake and alert because this medicine can make you drowsy. BE VERY CAREFUL not to take multiple medicines containing Tylenol (also called acetaminophen). Doing so can lead to an overdose which can damage your liver and cause liver failure and possibly death. Take any prescribed medications only as directed.   Home care instructions:  Follow any educational materials contained in this packet. Keep affected area above the level of your heart when possible. Wash area gently twice a day with warm soapy water. Do not apply alcohol or hydrogen peroxide. Cover the area if it draining or weeping.   Follow-up instructions: Please follow-up with your primary care provider in the next 1 week for further evaluation of your symptoms.   Return instructions:  Return to the Emergency Department if you have:  Fever  Worsening symptoms  Worsening pain  Worsening swelling  Redness of the skin that moves away from the affected area, especially if it streaks away from the affected area   Any other emergent concerns  Your vital signs today were: BP 125/70 (BP Location: Right Arm)    Pulse 77    Temp 98.3 F (36.8 C) (Oral)    Resp 18    SpO2 100%  If your blood pressure (BP) was elevated above 135/85 this visit, please have this repeated by your doctor within one month. --------------

## 2017-05-23 NOTE — ED Provider Notes (Signed)
[facility] DEPT Provider Note   CSN: [id number] Arrival date & time: 05/23/17  1628  By signing my name below, I, Caitlin Houston, attest that this documentation has been prepared under the direction and in the presence of Caitlin Houston  Electronically Signed: Ephriam Houston, ED Scribe. 05/23/17. 7:31 PM.   History   Chief Complaint Chief Complaint  Patient presents with  . Abscess    HPI HPI Comments: Caitlin Houston is a 41 y.o. female with Hx of Lupus, who presents to the Emergency Department in concern for an abscess to right inner thigh that started approximately 3 weeks ago. Pt was seen 2 weeks ago by her PCP who performed I&D on the abscess. Pt states that her PCP did not want the wound to close fully. She was given a prescription for abx but states that she did not fill it due to there financial situation. She also states that she did not have the proper materials to keep the abscess open and now it has closed up. She reports an increase in swelling and pain in the area since it closed up again. Pt has Hx of similar abscess on her back. Pt takes Xarelto and Hydroxyzine daily for her Lupus. She denies any fever or chills.  The history is provided by the patient. No language interpreter was used.    Past Medical History:  Diagnosis Date  . Atrial fibrillation ([place])   . DDD (degenerative disc disease), lumbar   . DVT (deep venous thrombosis) ([place])   . Lupus   . Pulmonary embolism ([place])   . TIA (transient ischemic attack)     There are no active problems to display for this patient.   Past Surgical History:  Procedure Laterality Date  . GASTRIC BYPASS      OB History    No data available       Home Medications    Prior to Admission medications   Medication Sig Start Date End Date Taking? Authorizing Provider  aspirin 81 MG tablet Take 1 tablet (81 mg total) by mouth 3 (three) times daily. Patient taking differently: Take 81 mg by mouth daily after supper.   05/31/15   Montine Circle, [place]  butalbital-acetaminophen-caffeine (FIORICET, ESGIC) [phone] MG per tablet Take 1 tablet by mouth every 6 (six) hours as needed for headache or migraine. 05/31/15   Montine Circle, [place]  clotrimazole (LOTRIMIN) 1 % external solution Apply 1 application topically at bedtime. 05/31/15   Montine Circle, [place]  Fluticasone-Salmeterol (ADVAIR) 250-50 MCG/DOSE AEPB Inhale 1 puff into the lungs 2 (two) times daily.    [provider]  hydroxychloroquine (PLAQUENIL) 200 MG tablet Take 1 tablet (200 mg total) by mouth 3 (three) times daily. Patient taking differently: Take 200 mg by mouth 2 (two) times daily.  05/31/15   Montine Circle, [place]  hydrOXYzine (ATARAX/VISTARIL) 25 MG tablet Take 1 tablet (25 mg total) by mouth every 6 (six) hours. 06/15/16   Junius Creamer, NP  loratadine (CLARITIN) 10 MG tablet Take 10 mg by mouth daily with lunch.    [provider]  meclizine (ANTIVERT) 12.5 MG tablet Take 1 tablet (12.5 mg total) by mouth 2 (two) times daily. Patient taking differently: Take 12.5 mg by mouth daily.  05/31/15   Montine Circle, [place]  oxyCODONE-acetaminophen (PERCOCET/ROXICET) 5-325 MG tablet Take 2 tablets by mouth every 4 (four) hours as needed for severe pain. 11/21/15   Patel-Mills, Orvil Feil, [place]  rivaroxaban (XARELTO) 20 MG TABS tablet Take 1 tablet (20  mg total) by mouth daily. Patient taking differently: Take 20 mg by mouth daily with supper.  05/31/15   Montine Circle, [place]    Family History History reviewed. No pertinent family history.  Social History Social History  Substance Use Topics  . Smoking status: Current Every Day Smoker    Types: Cigarettes  . Smokeless tobacco: Current User  . Alcohol use No     Allergies   Coconut flavor and Other   Review of Systems Review of Systems  Constitutional: Negative for chills and fever.  Gastrointestinal: Negative for nausea and vomiting.  Skin: Positive for wound (Abscess).  Negative for color change.       Positive for abscess.  Hematological: Negative for adenopathy.     Physical Exam Updated Vital Signs BP 125/70 (BP Location: Right Arm)   Pulse 77   Temp 98.3 F (36.8 C) (Oral)   Resp 18   SpO2 100%   Physical Exam  Constitutional: She is oriented to person, place, and time. She appears well-developed and well-nourished. No distress.  HENT:  Head: Normocephalic and atraumatic.  Neck: Normal range of motion.  Pulmonary/Chest: Effort normal.  Neurological: She is alert and oriented to person, place, and time.  Skin: Skin is warm and dry. She is not diaphoretic.  Mild erythema, no abscess R inner thigh. There is a scar from previous incision and drainage without any palpable fluctuance or abscess.  Psychiatric: She has a normal mood and affect. Judgment normal.  Nursing note and vitals reviewed.    ED Treatments / Results  DIAGNOSTIC STUDIES: Oxygen Saturation is 100% on RA, normal by my interpretation.  COORDINATION OF CARE: 6:47 PM- Will view abscess with female chaperone present. Discussed treatment plan with pt at bedside and pt agreed to plan.   RN chaperone during exam.   Pt urged to return with worsening pain, worsening swelling, expanding area of redness or streaking up extremity, fever, or any other concerns. Urged to take complete course of antibiotics as prescribed. Counseled to take pain medications as prescribed. Pt verbalizes understanding and agrees with plan.  Patient counseled on use of narcotic pain medications. Counseled not to combine these medications with others containing tylenol. Urged not to drink alcohol, drive, or perform any other activities that requires focus while taking these medications. The patient verbalizes understanding and agrees with the plan.  Patient requesting prescription for naproxen. She is on chronic anticoagulation for 20 years. She states that she has used naproxen at same time and interchanges  this with a baby aspirin without any previous issues.  Procedures Procedures (including critical care time)  Medications Ordered in ED Medications - No data to display   Initial Impression / Assessment and Plan / ED Course  I have reviewed the triage vital signs and the nursing notes.  Pertinent labs & imaging results that were available during my care of the patient were reviewed by me and considered in my medical decision making (see chart for details).     Vital signs reviewed and are as follows: Vitals:   05/23/17 1731  BP: 125/70  Pulse: 77  Resp: 18  Temp: 98.3 F (36.8 C)     Final Clinical Impressions(s) / ED Diagnoses   Final diagnoses:  Cellulitis of right lower extremity   Patient with Recurrent pain in area of recent abscess and incision and drainage. No recurrent abscess noted on exam tonight. Cellulitis is mild. Treatment with antibiotics, pain control.  New Prescriptions New Prescriptions  HYDROCODONE-ACETAMINOPHEN (NORCO/VICODIN) 5-325 MG TABLET    Take 1-2 tablets every 6 hours as needed for severe pain   NAPROXEN (NAPROSYN) 500 MG TABLET    Take 1 tablet (500 mg total) by mouth 2 (two) times daily.   SULFAMETHOXAZOLE-TRIMETHOPRIM (BACTRIM DS,SEPTRA DS) 800-160 MG TABLET    Take 1 tablet by mouth 2 (two) times daily.  I personally performed the services described in this documentation, which was scribed in my presence. The recorded information has been reviewed and is accurate.     Caitlin Cater, [place] 05/23/17 Langston Reusing    Charlesetta Shanks, MD 05/28/17 1239

## 2017-05-23 NOTE — ED Triage Notes (Signed)
Pt reports having an abscess on her inner thigh that needs to be drained, causing leg swelling and pain. Also needing prescriptions refilled. Denies fever. No acute distress is noted at triage.

## 2017-07-23 ENCOUNTER — Other Ambulatory Visit: Payer: Self-pay | Admitting: Internal Medicine

## 2017-07-23 DIAGNOSIS — R1032 Left lower quadrant pain: Secondary | ICD-10-CM

## 2017-07-23 DIAGNOSIS — R1011 Right upper quadrant pain: Secondary | ICD-10-CM

## 2017-07-31 ENCOUNTER — Other Ambulatory Visit: Payer: Medicare Other

## 2017-08-09 ENCOUNTER — Other Ambulatory Visit: Payer: Medicare Other

## 2017-08-13 ENCOUNTER — Ambulatory Visit: Payer: Medicare Other | Admitting: Obstetrics and Gynecology

## 2017-08-22 ENCOUNTER — Other Ambulatory Visit: Payer: Self-pay | Admitting: Obstetrics & Gynecology

## 2017-08-22 ENCOUNTER — Other Ambulatory Visit (HOSPITAL_COMMUNITY)
Admission: RE | Admit: 2017-08-22 | Discharge: 2017-08-22 | Disposition: A | Discharge status: Home / Self Care | Payer: Medicare Other | Source: Ambulatory Visit | Attending: Obstetrics & Gynecology | Admitting: Obstetrics & Gynecology

## 2017-08-22 ENCOUNTER — Ambulatory Visit (INDEPENDENT_AMBULATORY_CARE_PROVIDER_SITE_OTHER): Payer: Medicare Other | Admitting: Obstetrics & Gynecology

## 2017-08-22 ENCOUNTER — Encounter: Payer: Self-pay | Admitting: Obstetrics & Gynecology

## 2017-08-22 VITALS — BP 149/98 | HR 72 | Temp 97.1°F | Ht 65.5 in | Wt 290.9 lb

## 2017-08-22 DIAGNOSIS — Z23 Encounter for immunization: Secondary | ICD-10-CM

## 2017-08-22 DIAGNOSIS — M329 Systemic lupus erythematosus, unspecified: Secondary | ICD-10-CM | POA: Insufficient documentation

## 2017-08-22 DIAGNOSIS — Z01411 Encounter for gynecological examination (general) (routine) with abnormal findings: Secondary | ICD-10-CM | POA: Diagnosis not present

## 2017-08-22 DIAGNOSIS — Z79899 Other long term (current) drug therapy: Secondary | ICD-10-CM | POA: Insufficient documentation

## 2017-08-22 DIAGNOSIS — Z30432 Encounter for removal of intrauterine contraceptive device: Secondary | ICD-10-CM

## 2017-08-22 DIAGNOSIS — M5136 Other intervertebral disc degeneration, lumbar region: Secondary | ICD-10-CM | POA: Diagnosis not present

## 2017-08-22 DIAGNOSIS — N939 Abnormal uterine and vaginal bleeding, unspecified: Secondary | ICD-10-CM | POA: Insufficient documentation

## 2017-08-22 DIAGNOSIS — Z7901 Long term (current) use of anticoagulants: Secondary | ICD-10-CM | POA: Insufficient documentation

## 2017-08-22 DIAGNOSIS — B9689 Other specified bacterial agents as the cause of diseases classified elsewhere: Secondary | ICD-10-CM

## 2017-08-22 DIAGNOSIS — Z975 Presence of (intrauterine) contraceptive device: Secondary | ICD-10-CM | POA: Insufficient documentation

## 2017-08-22 DIAGNOSIS — N809 Endometriosis, unspecified: Secondary | ICD-10-CM | POA: Insufficient documentation

## 2017-08-22 DIAGNOSIS — Z86711 Personal history of pulmonary embolism: Secondary | ICD-10-CM | POA: Insufficient documentation

## 2017-08-22 DIAGNOSIS — N760 Acute vaginitis: Secondary | ICD-10-CM

## 2017-08-22 DIAGNOSIS — Z7982 Long term (current) use of aspirin: Secondary | ICD-10-CM | POA: Insufficient documentation

## 2017-08-22 DIAGNOSIS — Z8673 Personal history of transient ischemic attack (TIA), and cerebral infarction without residual deficits: Secondary | ICD-10-CM | POA: Diagnosis not present

## 2017-08-22 DIAGNOSIS — Z9884 Bariatric surgery status: Secondary | ICD-10-CM | POA: Insufficient documentation

## 2017-08-22 DIAGNOSIS — E282 Polycystic ovarian syndrome: Secondary | ICD-10-CM | POA: Insufficient documentation

## 2017-08-22 DIAGNOSIS — Z1151 Encounter for screening for human papillomavirus (HPV): Secondary | ICD-10-CM | POA: Diagnosis not present

## 2017-08-22 DIAGNOSIS — D259 Leiomyoma of uterus, unspecified: Secondary | ICD-10-CM | POA: Insufficient documentation

## 2017-08-22 DIAGNOSIS — F17210 Nicotine dependence, cigarettes, uncomplicated: Secondary | ICD-10-CM | POA: Insufficient documentation

## 2017-08-22 DIAGNOSIS — I4891 Unspecified atrial fibrillation: Secondary | ICD-10-CM | POA: Insufficient documentation

## 2017-08-22 DIAGNOSIS — D219 Benign neoplasm of connective and other soft tissue, unspecified: Secondary | ICD-10-CM

## 2017-08-22 DIAGNOSIS — Z01419 Encounter for gynecological examination (general) (routine) without abnormal findings: Secondary | ICD-10-CM | POA: Diagnosis not present

## 2017-08-22 DIAGNOSIS — R87612 Low grade squamous intraepithelial lesion on cytologic smear of cervix (LGSIL): Secondary | ICD-10-CM | POA: Diagnosis not present

## 2017-08-22 DIAGNOSIS — Z1231 Encounter for screening mammogram for malignant neoplasm of breast: Secondary | ICD-10-CM

## 2017-08-22 DIAGNOSIS — Z139 Encounter for screening, unspecified: Secondary | ICD-10-CM

## 2017-08-22 DIAGNOSIS — Z86718 Personal history of other venous thrombosis and embolism: Secondary | ICD-10-CM | POA: Diagnosis not present

## 2017-08-22 NOTE — Progress Notes (Signed)
GYNECOLOGY ANNUAL PREVENTATIVE CARE ENCOUNTER NOTE  Subjective:   Caitlin Houston is a 41 y.o. G40P1001 female here for a routine annual gynecologic exam and to establish GYN care.  Reports long history of SLE, PCOS, endometriosis (proven on laparoscopy), fibroids, pelvic pain and AUB. Had 3 year progestin IUD placed in 2014, unable to have it removed last year and the AUB has resumed. Desires removal of IUD today, wants to attempt pregnancy. Current complaints: pelvic pain, desires ultrasound to evaluate her fibroids. Also has abnormal malodorous discharge, wants evaluation.   Denies problems with intercourse or other gynecologic concerns.    Gynecologic History No LMP recorded (lmp unknown). Patient is not currently having periods (Reason: IUD). Contraception: IUD Last Pap: more than 3 years ago. Results were: normal  Obstetric History OB History  Gravida Para Term Preterm AB Living  1 1 1     1   SAB TAB Ectopic Multiple Live Births               # Outcome Date GA Lbr Len/2nd Weight Sex Delivery Anes PTL Lv  1 Term               Past Medical History:  Diagnosis Date  . Atrial fibrillation ([place])   . DDD (degenerative disc disease), lumbar   . DVT (deep venous thrombosis) ([place])   . Lupus   . Pulmonary embolism ([place])   . TIA (transient ischemic attack)     Past Surgical History:  Procedure Laterality Date  . GASTRIC BYPASS      Current Outpatient Prescriptions on File Prior to Visit  Medication Sig Dispense Refill  . aspirin 81 MG tablet Take 1 tablet (81 mg total) by mouth 3 (three) times daily. (Patient taking differently: Take 81 mg by mouth daily after supper. ) 30 tablet 0  . butalbital-acetaminophen-caffeine (FIORICET, ESGIC) 50-325-40 MG per tablet Take 1 tablet by mouth every 6 (six) hours as needed for headache or migraine. 20 tablet 0  . clotrimazole (LOTRIMIN) 1 % external solution Apply 1 application topically at bedtime. 30 mL 3  . Fluticasone-Salmeterol  (ADVAIR) 250-50 MCG/DOSE AEPB Inhale 1 puff into the lungs 2 (two) times daily.    Marland Kitchen HYDROcodone-acetaminophen (NORCO/VICODIN) 5-325 MG tablet Take 1-2 tablets every 6 hours as needed for severe pain 6 tablet 0  . hydroxychloroquine (PLAQUENIL) 200 MG tablet Take 1 tablet (200 mg total) by mouth 3 (three) times daily. (Patient taking differently: Take 200 mg by mouth 2 (two) times daily. ) 90 tablet 0  . hydrOXYzine (ATARAX/VISTARIL) 25 MG tablet Take 1 tablet (25 mg total) by mouth every 6 (six) hours. 12 tablet 0  . loratadine (CLARITIN) 10 MG tablet Take 10 mg by mouth daily with lunch.    . meclizine (ANTIVERT) 12.5 MG tablet Take 1 tablet (12.5 mg total) by mouth 2 (two) times daily. (Patient taking differently: Take 12.5 mg by mouth daily. ) 30 tablet 0  . naproxen (NAPROSYN) 500 MG tablet Take 1 tablet (500 mg total) by mouth 2 (two) times daily. 20 tablet 0  . oxyCODONE-acetaminophen (PERCOCET/ROXICET) 5-325 MG tablet Take 2 tablets by mouth every 4 (four) hours as needed for severe pain. 8 tablet 0  . rivaroxaban (XARELTO) 20 MG TABS tablet Take 1 tablet (20 mg total) by mouth daily. (Patient taking differently: Take 20 mg by mouth daily with supper. ) 30 tablet 0   No current facility-administered medications on file prior to visit.     Allergies  Allergen Reactions  . Coconut Flavor Itching  . Other Itching    Cauliflower, squash cause itching    Social History   Social History  . Marital status: Single    Spouse name: N/A  . Number of children: N/A  . Years of education: N/A   Occupational History  . Not on file.   Social History Main Topics  . Smoking status: Current Every Day Smoker    Types: Cigarettes  . Smokeless tobacco: Current User  . Alcohol use No  . Drug use: No  . Sexual activity: Yes    Partners: Male   Other Topics Concern  . Not on file   Social History Narrative  . No narrative on file    No family history on file.  The following portions  of the patient's history were reviewed and updated as appropriate: allergies, current medications, past family history, past medical history, past social history, past surgical history and problem list.  Review of Systems Pertinent items noted in HPI and remainder of comprehensive ROS otherwise negative.   Objective:  BP (!) 149/98   Pulse 72   Temp (!) 97.1 F (36.2 C)   Ht 5' 5.5" (1.664 m)   Wt 290 lb 14.4 oz (132 kg)   LMP  (LMP Unknown)   BMI 47.67 kg/m  CONSTITUTIONAL: Well-developed, well-nourished female in no acute distress. Very nervous about exam. HENT:  Normocephalic, atraumatic, External right and left ear normal. Oropharynx is clear and moist EYES: Conjunctivae and EOM are normal. Pupils are equal, round, and reactive to light. No scleral icterus.  NECK: Normal range of motion, supple, no masses.  Normal thyroid.  SKIN: Skin is warm and dry. No rash noted. Not diaphoretic. No erythema. No pallor. NEUROLOGIC: Alert and oriented to person, place, and time. Normal reflexes, muscle tone coordination. No cranial nerve deficit noted. PSYCHIATRIC: Normal mood and affect. Normal behavior. Normal judgment and thought content. CARDIOVASCULAR: Normal heart rate noted, regular rhythm RESPIRATORY: Clear to auscultation bilaterally. Effort and breath sounds normal, no problems with respiration noted. BREASTS: Symmetric in size. No masses, skin changes, nipple drainage, or lymphadenopathy. ABDOMEN: Soft, obese, normal bowel sounds, no distention appreciated.  No tenderness, rebound or guarding.  PELVIC: Normal appearing external genitalia; normal appearing vaginal mucosa and cervix.  IUD strings visualized. Normal appearing discharge.  Pap smear obtained.  Unable to palpate uterus or adnexa secondary to habitus.  MUSCULOSKELETAL: Normal range of motion. No tenderness.  No cyanosis, clubbing, or edema.  2+ distal pulses.   IUD Removal  Patient identified, informed consent performed,  consent signed. During pelvic exam, the strings of the IUD were grasped and pulled using ring forceps. The IUD was removed in its entirety. Patient tolerated the procedure well.     Assessment and Plan:  1. Endometriosis 2. PCOS (polycystic ovarian syndrome) 3. Abnormal uterine bleeding (AUB) 4. Fibroids Will obtain ultrasound today for further evaluation of AUB. Denies any symptoms of anemia. No bleeding noted on exam today.  Patient told that pregnancy attempt can be hampered by these issues. No management for AUB for now, patient declines this as she wants to attempt pregnancy. Bleeding precautions reveiwed. - US PELVIC COMPLETE WITH TRANSVAGINAL; Future  5. Need for immunization against influenza - Flu Vaccine QUAD 36+ mos IM given today  6. IUD removal IUD removed today.  Patient plans for pregnancy soon and she was told to avoid teratogens, take PNV and folic acid.  Was cautioned about very high risk nature  of any pregnancy given multiple co-morbidities   7. Encounter for gynecological examination with Papanicolaou smear of cervix - Cytology - PAP - Cervicovaginal ancillary only - Hepatitis B surface antigen - Hepatitis C antibody - RPR - HIV antibody Will follow up results and manage accordingly.  8. Visit for screening mammogram - MM SCREENING BREAST TOMO BILATERAL; Future  Routine preventative health maintenance measures emphasized. Please refer to After Visit Summary for other counseling recommendations.    Verita Schneiders, MD, [place] Attending Obstetrician & Gynecologist, [place] [facility]

## 2017-08-22 NOTE — Patient Instructions (Addendum)
Please take daily multivitamin with folic acid Avoid any toxic agents  Thank you for enrolling in MyChart. Please follow the instructions below to securely access your online medical record. MyChart allows you to send messages to your doctor, view your test results, manage appointments, and more.   How Do I Sign Up? 1. In your Internet browser, go to [employer] and enter [email or web address]. 2. Click on the Sign Up Now link in the Sign In box. You will see the New Member Sign Up page. 3. Enter your MyChart Access Code exactly as it appears below. You will not need to use this code after you've completed the sign-up process. If you do not sign up before the expiration date, you must request a new code.  MyChart Access Code: [email or web address] Expires: 10/06/2017  3:44 PM  4. Enter your Social Security Number ([email or web address]) and Date of Birth (mm/dd/yyyy) as indicated and click Submit. You will be taken to the next sign-up page. 5. Create a MyChart ID. This will be your MyChart login ID and cannot be changed, so think of one that is secure and easy to remember. 6. Create a MyChart password. You can change your password at any time. 7. Enter your Password Reset Question and Answer. This can be used at a later time if you forget your password.  8. Enter your e-mail address. You will receive e-mail notification when new information is available in [place]. 9. Click Sign Up. You can now view your medical record.   Additional Information Remember, MyChart is NOT to be used for urgent needs. For medical emergencies, dial 911.       Preventive Care 40-64 Years, Female Preventive care refers to lifestyle choices and visits with your health care provider that can promote health and wellness. What does preventive care include?  A yearly physical exam. This is also called an annual well check.  Dental exams once or twice a year.  Routine eye exams. Ask your health care provider how  often you should have your eyes checked.  Personal lifestyle choices, including: ? Daily care of your teeth and gums. ? Regular physical activity. ? Eating a healthy diet. ? Avoiding tobacco and drug use. ? Limiting alcohol use. ? Practicing safe sex. ? Taking low-dose aspirin daily starting at age 44. ? Taking vitamin and mineral supplements as recommended by your health care provider. What happens during an annual well check? The services and screenings done by your health care provider during your annual well check will depend on your age, overall health, lifestyle risk factors, and family history of disease. Counseling Your health care provider may ask you questions about your:  Alcohol use.  Tobacco use.  Drug use.  Emotional well-being.  Home and relationship well-being.  Sexual activity.  Eating habits.  Work and work Statistician.  Method of birth control.  Menstrual cycle.  Pregnancy history.  Screening You may have the following tests or measurements:  Height, weight, and BMI.  Blood pressure.  Lipid and cholesterol levels. These may be checked every 5 years, or more frequently if you are over 60 years old.  Skin check.  Lung cancer screening. You may have this screening every year starting at age 12 if you have a 30-pack-year history of smoking and currently smoke or have quit within the past 15 years.  Fecal occult blood test (FOBT) of the stool. You may have this test every year starting at age 43.  Flexible sigmoidoscopy or colonoscopy. You may have  a sigmoidoscopy every 5 years or a colonoscopy every 10 years starting at age 18.  Hepatitis C blood test.  Hepatitis B blood test.  Sexually transmitted disease (STD) testing.  Diabetes screening. This is done by checking your blood sugar (glucose) after you have not eaten for a while (fasting). You may have this done every 1-3 years.  Mammogram. This may be done every 1-2 years. Talk to your  health care provider about when you should start having regular mammograms. This may depend on whether you have a family history of breast cancer.  BRCA-related cancer screening. This may be done if you have a family history of breast, ovarian, tubal, or peritoneal cancers.  Pelvic exam and Pap test. This may be done every 3 years starting at age 36. Starting at age 36, this may be done every 5 years if you have a Pap test in combination with an HPV test.  Bone density scan. This is done to screen for osteoporosis. You may have this scan if you are at high risk for osteoporosis.  Discuss your test results, treatment options, and if necessary, the need for more tests with your health care provider. Vaccines Your health care provider may recommend certain vaccines, such as:  Influenza vaccine. This is recommended every year.  Tetanus, diphtheria, and acellular pertussis (Tdap, Td) vaccine. You may need a Td booster every 10 years.  Varicella vaccine. You may need this if you have not been vaccinated.  Zoster vaccine. You may need this after age 32.  Measles, mumps, and rubella (MMR) vaccine. You may need at least one dose of MMR if you were born in 1957 or later. You may also need a second dose.  Pneumococcal 13-valent conjugate (PCV13) vaccine. You may need this if you have certain conditions and were not previously vaccinated.  Pneumococcal polysaccharide (PPSV23) vaccine. You may need one or two doses if you smoke cigarettes or if you have certain conditions.  Meningococcal vaccine. You may need this if you have certain conditions.  Hepatitis A vaccine. You may need this if you have certain conditions or if you travel or work in places where you may be exposed to hepatitis A.  Hepatitis B vaccine. You may need this if you have certain conditions or if you travel or work in places where you may be exposed to hepatitis B.  Haemophilus influenzae type b (Hib) vaccine. You may need this  if you have certain conditions.  Talk to your health care provider about which screenings and vaccines you need and how often you need them. This information is not intended to replace advice given to you by your health care provider. Make sure you discuss any questions you have with your health care provider. Document Released: 11/12/2015 Document Revised: 07/05/2016 Document Reviewed: 08/17/2015 Elsevier Interactive Patient Education  2017 [employer].

## 2017-08-22 NOTE — Progress Notes (Signed)
New GYN. Has Lupus, PCOS and Endometriosis. NV with Migraine headaches.  IUD - Mirena was placed in 2014.  Last PAP 3 yrs ago. Wants STD Testing.

## 2017-08-23 ENCOUNTER — Encounter: Payer: Self-pay | Admitting: Obstetrics & Gynecology

## 2017-08-23 ENCOUNTER — Encounter: Payer: Self-pay | Admitting: *Deleted

## 2017-08-23 LAB — HEPATITIS B SURFACE ANTIGEN: HEP B S AG: NEGATIVE

## 2017-08-23 LAB — RPR: RPR: NONREACTIVE

## 2017-08-23 LAB — HIV ANTIBODY (ROUTINE TESTING W REFLEX): HIV Screen 4th Generation wRfx: NONREACTIVE

## 2017-08-23 LAB — CERVICOVAGINAL ANCILLARY ONLY
Bacterial vaginitis: POSITIVE — AB
CANDIDA VAGINITIS: NEGATIVE
Chlamydia: NEGATIVE
Neisseria Gonorrhea: NEGATIVE
TRICH (WINDOWPATH): NEGATIVE

## 2017-08-23 LAB — HEPATITIS C ANTIBODY: HEP C VIRUS AB: 0.1 {s_co_ratio} (ref 0.0–0.9)

## 2017-08-24 MED ORDER — METRONIDAZOLE 500 MG PO TABS: 500.0000 mg | ORAL_TABLET | Freq: Two times a day (BID) | ORAL | 2 refills | Status: AC

## 2017-08-24 NOTE — Addendum Note (Signed)
Addended by: Verita Schneiders A on: 08/24/2017 12:00 PM   Modules accepted: Orders

## 2017-08-27 ENCOUNTER — Encounter: Payer: Self-pay | Admitting: Obstetrics & Gynecology

## 2017-08-27 ENCOUNTER — Encounter: Payer: Self-pay | Admitting: Pediatrics

## 2017-08-27 ENCOUNTER — Ambulatory Visit (HOSPITAL_COMMUNITY): Payer: Medicare Other | Attending: Obstetrics & Gynecology

## 2017-08-27 DIAGNOSIS — R87612 Low grade squamous intraepithelial lesion on cytologic smear of cervix (LGSIL): Secondary | ICD-10-CM | POA: Insufficient documentation

## 2017-08-27 LAB — CYTOLOGY - PAP: HPV: NOT DETECTED

## 2017-09-04 ENCOUNTER — Encounter: Payer: Self-pay | Admitting: Obstetrics & Gynecology

## 2017-09-05 ENCOUNTER — Encounter: Payer: Self-pay | Admitting: *Deleted

## 2017-09-06 ENCOUNTER — Encounter: Payer: Self-pay | Admitting: *Deleted

## 2017-09-11 ENCOUNTER — Ambulatory Visit: Payer: Medicare Other

## 2017-09-25 ENCOUNTER — Encounter: Payer: Medicare Other | Admitting: Obstetrics and Gynecology

## 2017-11-02 ENCOUNTER — Encounter: Payer: Self-pay | Admitting: Obstetrics & Gynecology

## 2017-11-05 ENCOUNTER — Other Ambulatory Visit: Payer: Self-pay

## 2017-11-05 ENCOUNTER — Emergency Department (HOSPITAL_COMMUNITY)
Admission: EM | Admit: 2017-11-05 | Discharge: 2017-11-05 | Disposition: A | Discharge status: Home / Self Care | Payer: Medicare Other | Attending: Emergency Medicine | Admitting: Emergency Medicine

## 2017-11-05 ENCOUNTER — Encounter (HOSPITAL_COMMUNITY): Payer: Self-pay

## 2017-11-05 DIAGNOSIS — Z79899 Other long term (current) drug therapy: Secondary | ICD-10-CM | POA: Diagnosis not present

## 2017-11-05 DIAGNOSIS — Z7901 Long term (current) use of anticoagulants: Secondary | ICD-10-CM | POA: Diagnosis not present

## 2017-11-05 DIAGNOSIS — Z7982 Long term (current) use of aspirin: Secondary | ICD-10-CM | POA: Diagnosis not present

## 2017-11-05 DIAGNOSIS — R072 Precordial pain: Secondary | ICD-10-CM | POA: Diagnosis not present

## 2017-11-05 DIAGNOSIS — F17210 Nicotine dependence, cigarettes, uncomplicated: Secondary | ICD-10-CM | POA: Insufficient documentation

## 2017-11-05 LAB — I-STAT BETA HCG BLOOD, ED (MC, WL, AP ONLY): I-stat hCG, quantitative: <5 m[IU]/mL (ref <5)

## 2017-11-05 LAB — CBC
HCT: 31.1 % — ABNORMAL LOW (ref 36.0–46.0)
Hemoglobin: 10 g/dL — ABNORMAL LOW (ref 12.0–15.0)
MCH: 27.8 pg (ref 26.0–34.0)
MCHC: 32.2 g/dL (ref 30.0–36.0)
MCV: 86.4 fL (ref 78.0–100.0)
Platelets: 400 10*3/uL (ref 150–400)
RBC: 3.6 MIL/uL — ABNORMAL LOW (ref 3.87–5.11)
RDW: 15.5 % (ref 11.5–15.5)
WBC: 4.2 10*3/uL (ref 4.0–10.5)

## 2017-11-05 LAB — COMPREHENSIVE METABOLIC PANEL
ALK PHOS: 62 U/L (ref 38–126)
ALT: 11 U/L — AB (ref 14–54)
AST: 25 U/L (ref 15–41)
Albumin: 2.8 g/dL — ABNORMAL LOW (ref 3.5–5.0)
Anion gap: 6 (ref 5–15)
BILIRUBIN TOTAL: 0.9 mg/dL (ref 0.3–1.2)
BUN: 8 mg/dL (ref 6–20)
CO2: 19 mmol/L — AB (ref 22–32)
CREATININE: 0.67 mg/dL (ref 0.44–1.00)
Calcium: 7.3 mg/dL — ABNORMAL LOW (ref 8.9–10.3)
Chloride: 115 mmol/L — ABNORMAL HIGH (ref 101–111)
GFR calc non Af Amer: >60 mL/min (ref >60)
GLUCOSE: 80 mg/dL (ref 65–99)
Potassium: 3.5 mmol/L (ref 3.5–5.1)
SODIUM: 140 mmol/L (ref 135–145)
TOTAL PROTEIN: 5.8 g/dL — AB (ref 6.5–8.1)

## 2017-11-05 LAB — I-STAT TROPONIN, ED: TROPONIN I, POC: 0 ng/mL (ref 0.00–0.08)

## 2017-11-05 LAB — LIPASE, BLOOD: Lipase: 18 U/L (ref 11–51)

## 2017-11-05 MED ORDER — OMEPRAZOLE 20 MG PO CPDR: 20.0000 mg | DELAYED_RELEASE_CAPSULE | Freq: Every day | ORAL | 0 refills | Status: AC

## 2017-11-05 MED ORDER — GI COCKTAIL ~~LOC~~
30.0000 mL | Freq: Once | ORAL | Status: AC
  Administered 2017-11-05: 30 mL via ORAL
  Filled 2017-11-05: qty 30

## 2017-11-05 MED ORDER — ONDANSETRON 8 MG PO TBDP: 8.0000 mg | ORAL_TABLET | Freq: Three times a day (TID) | ORAL | 0 refills | Status: AC | PRN

## 2017-11-05 MED ORDER — MORPHINE SULFATE (PF) 4 MG/ML IV SOLN
4.0000 mg | Freq: Once | INTRAVENOUS | Status: AC
  Administered 2017-11-05: 4 mg via INTRAVENOUS
  Filled 2017-11-05: qty 1

## 2017-11-05 MED ORDER — LORAZEPAM 2 MG/ML IJ SOLN
1.0000 mg | Freq: Once | INTRAMUSCULAR | Status: AC
  Administered 2017-11-05: 1 mg via INTRAVENOUS
  Filled 2017-11-05: qty 1

## 2017-11-05 MED ORDER — ONDANSETRON HCL 4 MG/2ML IJ SOLN
4.0000 mg | Freq: Once | INTRAMUSCULAR | Status: AC
  Administered 2017-11-05: 4 mg via INTRAVENOUS
  Filled 2017-11-05: qty 2

## 2017-11-05 NOTE — ED Provider Notes (Signed)
[facility] EMERGENCY DEPARTMENT Provider Note   CSN: [id number] Arrival date & time: 11/05/17  0417     History   Chief Complaint Chief Complaint  Patient presents with  . Chest Pain    HPI Caitlin Houston is a 42 y.o. female.  HPI 42 year old female presents emergency department with ongoing recurrent chest pain radiating from her upper abdomen up into her chest over the past 1-2 weeks.  Denies fevers and chills.  No shortness of breath.  Denies productive cough.  She has a history of costochondritis as well.  No history of coronary artery disease.  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   Past Medical History:  Diagnosis Date  . Atrial fibrillation ([place])   . DDD (degenerative disc disease), lumbar   . DVT (deep venous thrombosis) ([place])   . Lupus   . Pulmonary embolism ([place])   . TIA (transient ischemic attack)     Patient Active Problem List   Diagnosis Date Noted  . LGSIL on Pap smear of cervix on 08/22/17 08/27/2017  . Fibroids 08/22/2017  . Endometriosis 08/22/2017  . PCOS (polycystic ovarian syndrome) 08/22/2017  . Abnormal uterine bleeding (AUB) 08/22/2017    Past Surgical History:  Procedure Laterality Date  . GASTRIC BYPASS    . LAPAROSCOPY     Endometriosis diagnosis    OB History    Gravida Para Term Preterm AB Living   1 1 1     1    SAB TAB Ectopic Multiple Live Births                   Home Medications    Prior to Admission medications   Medication Sig Start Date End Date Taking? Authorizing Provider  aspirin 81 MG tablet Take 1 tablet (81 mg total) by mouth 3 (three) times daily. Patient taking differently: Take 81 mg by mouth daily after supper.  05/31/15   Montine Circle, PA-C  butalbital-acetaminophen-caffeine (FIORICET, ESGIC) [phone] MG per tablet Take 1 tablet by mouth every 6 (six) hours as needed for headache or migraine. 05/31/15   Montine Circle, PA-C  clotrimazole  (LOTRIMIN) 1 % external solution Apply 1 application topically at bedtime. 05/31/15   Montine Circle, PA-C  cyclobenzaprine (FLEXERIL) 10 MG tablet Take 10 mg by mouth 3 (three) times daily as needed for muscle spasms.    [provider]  Fluticasone-Salmeterol (ADVAIR) 250-50 MCG/DOSE AEPB Inhale 1 puff into the lungs 2 (two) times daily.    [provider]  HYDROcodone-acetaminophen (NORCO/VICODIN) 5-325 MG tablet Take 1-2 tablets every 6 hours as needed for severe pain 05/23/17   Carlisle Cater, PA-C  hydroxychloroquine (PLAQUENIL) 200 MG tablet Take 1 tablet (200 mg total) by mouth 3 (three) times daily. Patient taking differently: Take 200 mg by mouth 2 (two) times daily.  05/31/15   Montine Circle, PA-C  hydrOXYzine (ATARAX/VISTARIL) 25 MG tablet Take 1 tablet (25 mg total) by mouth every 6 (six) hours. 06/15/16   Junius Creamer, NP  loratadine (CLARITIN) 10 MG tablet Take 10 mg by mouth daily with lunch.    [provider]  meclizine (ANTIVERT) 12.5 MG tablet Take 1 tablet (12.5 mg total) by mouth 2 (two) times daily. Patient taking differently: Take 12.5 mg by mouth daily.  05/31/15   Montine Circle, PA-C  metroNIDAZOLE (FLAGYL) 500 MG tablet Take 1 tablet (500 mg total) by mouth 2 (two) times daily. 08/24/17  Anyanwu, Sallyanne Havers, MD  naproxen (NAPROSYN) 500 MG tablet Take 1 tablet (500 mg total) by mouth 2 (two) times daily. 05/23/17   Carlisle Cater, PA-C  omeprazole (PRILOSEC) 20 MG capsule Take 1 capsule (20 mg total) by mouth daily. 11/05/17   Jola Schmidt, MD  ondansetron (ZOFRAN ODT) 8 MG disintegrating tablet Take 1 tablet (8 mg total) by mouth every 8 (eight) hours as needed for nausea or vomiting. 11/05/17   Jola Schmidt, MD  oxyCODONE-acetaminophen (PERCOCET/ROXICET) 5-325 MG tablet Take 2 tablets by mouth every 4 (four) hours as needed for severe pain. 11/21/15   Patel-Mills, Orvil Feil, PA-C  rivaroxaban (XARELTO) 20 MG TABS tablet Take 1 tablet (20 mg total) by mouth  daily. Patient taking differently: Take 20 mg by mouth daily with supper.  05/31/15   Montine Circle, PA-C  SUMAtriptan (IMITREX) 50 MG tablet TK 1 T PO BID 07/26/17   [provider]  traMADol (ULTRAM) 50 MG tablet TK 1 T PO BID 07/29/17   [provider]    Family History History reviewed. No pertinent family history.  Social History Social History   Tobacco Use  . Smoking status: Current Every Day Smoker    Types: Cigarettes  . Smokeless tobacco: Current User  Substance Use Topics  . Alcohol use: No  . Drug use: No     Allergies   Coconut flavor and Other   Review of Systems Review of Systems  All other systems reviewed and are negative.    Physical Exam Updated Vital Signs BP 112/63   Pulse 86   Temp 98.6 F (37 C) (Oral)   Ht 5\' 6"  (1.676 m)   Wt 128.4 kg (283 lb)   SpO2 99%   BMI 45.68 kg/m   Physical Exam  Constitutional: She is oriented to person, place, and time. She appears well-developed and well-nourished. No distress.  HENT:  Head: Normocephalic and atraumatic.  Eyes: EOM are normal.  Neck: Normal range of motion.  Cardiovascular: Normal rate, regular rhythm and normal heart sounds.  Pulmonary/Chest: Effort normal and breath sounds normal.  Abdominal: Soft. She exhibits no distension. There is no tenderness.  Musculoskeletal: Normal range of motion.  Neurological: She is alert and oriented to person, place, and time.  Skin: Skin is warm and dry.  Psychiatric: She has a normal mood and affect. Judgment normal.  Nursing note and vitals reviewed.    ED Treatments / Results  Labs (all labs ordered are listed, but only abnormal results are displayed) Labs Reviewed  CBC - Abnormal; Notable for the following components:      Result Value   RBC 3.60 (*)    Hemoglobin 10.0 (*)    HCT 31.1 (*)    All other components within normal limits  COMPREHENSIVE METABOLIC PANEL - Abnormal; Notable for the following components:    Chloride 115 (*)    CO2 19 (*)    Calcium 7.3 (*)    Total Protein 5.8 (*)    Albumin 2.8 (*)    ALT 11 (*)    All other components within normal limits  LIPASE, BLOOD  I-STAT TROPONIN, ED  I-STAT BETA HCG BLOOD, ED ([facility], [facility], [facility] ONLY)    EKG  EKG Interpretation  Date/Time:  Monday November 05 2017 04:17:34 EST Ventricular Rate:  73 PR Interval:    QRS Duration: 105 QT Interval:  428 QTC Calculation: 472 R Axis:   53 Text Interpretation:  Sinus rhythm No significant change was found Confirmed  by Jola Schmidt [phone]) on 11/05/2017 4:19:54 AM Also confirmed by Jola Schmidt [phone]), editor Hattie Perch ([id number])  on 11/05/2017 7:01:50 AM       Radiology No results found.  Procedures Procedures (including critical care time)  Medications Ordered in ED Medications  ondansetron (ZOFRAN) injection 4 mg (4 mg Intravenous Given 11/05/17 0648)  morphine 4 MG/ML injection 4 mg (4 mg Intravenous Given 11/05/17 0649)  LORazepam (ATIVAN) injection 1 mg (1 mg Intravenous Given 11/05/17 0649)  gi cocktail (Maalox,Lidocaine,Donnatal) (30 mLs Oral Given 11/05/17 1975)     Initial Impression / Assessment and Plan / ED Course  I have reviewed the triage vital signs and the nursing notes.  Pertinent labs & imaging results that were available during my care of the patient were reviewed by me and considered in my medical decision making (see chart for details).     Overall well-appearing.  Significant improvement after GI cocktail.  Tolerating fluids.  Feels much better.  Doubt ACS.  Doubt recurrent PE while on Xarelto.  No indication for additional workup or acute hospitalization.  Close primary care follow-up.  She understands to return to the ER for new or worsening symptoms  Final Clinical Impressions(s) / ED Diagnoses   Final diagnoses:  None    ED Discharge Orders        Ordered    omeprazole (PRILOSEC) 20 MG capsule  Daily     11/05/17 0736    ondansetron (ZOFRAN ODT) 8 MG  disintegrating tablet  Every 8 hours PRN     11/05/17 0736       Jola Schmidt, MD 11/05/17 [phone]

## 2017-11-05 NOTE — ED Triage Notes (Signed)
Pt. Via EMS with chest pain since christmas. Pt. On tramadol and percocet for costochondritis secondary to lupus. Pt. Has not had pain medicine since Thursday. Pt. Given 100 mcg fentanyl and 8 mg zofran en route.

## 2018-06-18 ENCOUNTER — Other Ambulatory Visit: Payer: Self-pay | Admitting: Internal Medicine

## 2018-06-18 ENCOUNTER — Ambulatory Visit
Admission: RE | Admit: 2018-06-18 | Discharge: 2018-06-18 | Disposition: A | Discharge status: Home / Self Care | Payer: Medicare Other | Source: Ambulatory Visit | Attending: Internal Medicine | Admitting: Internal Medicine

## 2018-06-18 DIAGNOSIS — M25522 Pain in left elbow: Secondary | ICD-10-CM

## 2020-06-09 ENCOUNTER — Emergency Department (HOSPITAL_BASED_OUTPATIENT_CLINIC_OR_DEPARTMENT_OTHER)
Admission: EM | Admit: 2020-06-09 | Discharge: 2020-06-09 | Discharge status: Against Medical Advice | Payer: Medicare HMO | Attending: Emergency Medicine | Admitting: Emergency Medicine

## 2020-06-09 ENCOUNTER — Encounter (HOSPITAL_BASED_OUTPATIENT_CLINIC_OR_DEPARTMENT_OTHER): Payer: Self-pay | Admitting: *Deleted

## 2020-06-09 ENCOUNTER — Emergency Department (HOSPITAL_BASED_OUTPATIENT_CLINIC_OR_DEPARTMENT_OTHER): Payer: Medicare HMO

## 2020-06-09 ENCOUNTER — Other Ambulatory Visit: Payer: Self-pay

## 2020-06-09 DIAGNOSIS — R6510 Systemic inflammatory response syndrome (SIRS) of non-infectious origin without acute organ dysfunction: Secondary | ICD-10-CM | POA: Diagnosis not present

## 2020-06-09 DIAGNOSIS — Z91040 Latex allergy status: Secondary | ICD-10-CM | POA: Diagnosis not present

## 2020-06-09 DIAGNOSIS — Z7982 Long term (current) use of aspirin: Secondary | ICD-10-CM | POA: Insufficient documentation

## 2020-06-09 DIAGNOSIS — R0602 Shortness of breath: Secondary | ICD-10-CM | POA: Diagnosis present

## 2020-06-09 DIAGNOSIS — R197 Diarrhea, unspecified: Secondary | ICD-10-CM | POA: Insufficient documentation

## 2020-06-09 DIAGNOSIS — Z20822 Contact with and (suspected) exposure to covid-19: Secondary | ICD-10-CM | POA: Insufficient documentation

## 2020-06-09 DIAGNOSIS — R531 Weakness: Secondary | ICD-10-CM | POA: Insufficient documentation

## 2020-06-09 DIAGNOSIS — J189 Pneumonia, unspecified organism: Secondary | ICD-10-CM | POA: Insufficient documentation

## 2020-06-09 DIAGNOSIS — M7918 Myalgia, other site: Secondary | ICD-10-CM | POA: Insufficient documentation

## 2020-06-09 DIAGNOSIS — R000 Tachycardia, unspecified: Secondary | ICD-10-CM | POA: Insufficient documentation

## 2020-06-09 DIAGNOSIS — Z79899 Other long term (current) drug therapy: Secondary | ICD-10-CM | POA: Insufficient documentation

## 2020-06-09 DIAGNOSIS — F17210 Nicotine dependence, cigarettes, uncomplicated: Secondary | ICD-10-CM | POA: Insufficient documentation

## 2020-06-09 DIAGNOSIS — E669 Obesity, unspecified: Secondary | ICD-10-CM | POA: Diagnosis not present

## 2020-06-09 LAB — LACTATE DEHYDROGENASE: LDH: 634 U/L — ABNORMAL HIGH (ref 98–192)

## 2020-06-09 LAB — CBC WITH DIFFERENTIAL/PLATELET
Abs Immature Granulocytes: 0.08 10*3/uL — ABNORMAL HIGH (ref 0.00–0.07)
Basophils Absolute: 0.1 10*3/uL (ref 0.0–0.1)
Basophils Relative: 0 %
Eosinophils Absolute: 0 10*3/uL (ref 0.0–0.5)
Eosinophils Relative: 0 %
HCT: 34.5 % — ABNORMAL LOW (ref 36.0–46.0)
Hemoglobin: 11.3 g/dL — ABNORMAL LOW (ref 12.0–15.0)
Immature Granulocytes: 1 %
Lymphocytes Relative: 8 %
Lymphs Abs: 1.2 10*3/uL (ref 0.7–4.0)
MCH: 30.9 pg (ref 26.0–34.0)
MCHC: 32.8 g/dL (ref 30.0–36.0)
MCV: 94.3 fL (ref 80.0–100.0)
Monocytes Absolute: 0.6 10*3/uL (ref 0.1–1.0)
Monocytes Relative: 4 %
Neutro Abs: 12.3 10*3/uL — ABNORMAL HIGH (ref 1.7–7.7)
Neutrophils Relative %: 87 %
Platelets: 322 10*3/uL (ref 150–400)
RBC: 3.66 MIL/uL — ABNORMAL LOW (ref 3.87–5.11)
RDW: 14.8 % (ref 11.5–15.5)
WBC: 14.2 10*3/uL — ABNORMAL HIGH (ref 4.0–10.5)
nRBC: 0 % (ref 0.0–0.2)

## 2020-06-09 LAB — COMPREHENSIVE METABOLIC PANEL
ALT: 93 U/L — ABNORMAL HIGH (ref 0–44)
AST: 64 U/L — ABNORMAL HIGH (ref 15–41)
Albumin: 3.1 g/dL — ABNORMAL LOW (ref 3.5–5.0)
Alkaline Phosphatase: 232 U/L — ABNORMAL HIGH (ref 38–126)
Anion gap: 11 (ref 5–15)
BUN: 10 mg/dL (ref 6–20)
CO2: 21 mmol/L — ABNORMAL LOW (ref 22–32)
Calcium: 8.8 mg/dL — ABNORMAL LOW (ref 8.9–10.3)
Chloride: 100 mmol/L (ref 98–111)
Creatinine, Ser: 0.87 mg/dL (ref 0.44–1.00)
GFR calc Af Amer: >60 mL/min (ref >60)
GFR calc non Af Amer: >60 mL/min (ref >60)
Glucose, Bld: 124 mg/dL — ABNORMAL HIGH (ref 70–99)
Potassium: 4.1 mmol/L (ref 3.5–5.1)
Sodium: 132 mmol/L — ABNORMAL LOW (ref 135–145)
Total Bilirubin: 0.6 mg/dL (ref 0.3–1.2)
Total Protein: 8.3 g/dL — ABNORMAL HIGH (ref 6.5–8.1)

## 2020-06-09 LAB — TRIGLYCERIDES: Triglycerides: 78 mg/dL (ref <150)

## 2020-06-09 LAB — C-REACTIVE PROTEIN: CRP: 33.8 mg/dL — ABNORMAL HIGH (ref <1.0)

## 2020-06-09 LAB — LACTIC ACID, PLASMA
Lactic Acid, Venous: 1.1 mmol/L (ref 0.5–1.9)
Lactic Acid, Venous: 0.9 mmol/L (ref 0.5–1.9)

## 2020-06-09 LAB — FIBRINOGEN: Fibrinogen: >800 mg/dL — ABNORMAL HIGH (ref 210–475)

## 2020-06-09 LAB — RESP PANEL BY RT PCR (RSV, FLU A&B, COVID)
Influenza A by PCR: NEGATIVE
Influenza B by PCR: NEGATIVE
Respiratory Syncytial Virus by PCR: NEGATIVE
SARS Coronavirus 2 by RT PCR: NEGATIVE

## 2020-06-09 LAB — D-DIMER, QUANTITATIVE: D-Dimer, Quant: 1.95 ug/mL-FEU — ABNORMAL HIGH (ref 0.00–0.50)

## 2020-06-09 LAB — SARS CORONAVIRUS 2 BY RT PCR (HOSPITAL ORDER, PERFORMED IN ~~LOC~~ HOSPITAL LAB): SARS Coronavirus 2: NEGATIVE

## 2020-06-09 LAB — HIV ANTIBODY (ROUTINE TESTING W REFLEX): HIV Screen 4th Generation wRfx: NONREACTIVE

## 2020-06-09 LAB — PROCALCITONIN: Procalcitonin: 0.84 ng/mL

## 2020-06-09 LAB — FERRITIN: Ferritin: 225 ng/mL (ref 11–307)

## 2020-06-09 MED ORDER — ALBUTEROL SULFATE HFA 108 (90 BASE) MCG/ACT IN AERS
8.0000 | INHALATION_SPRAY | Freq: Once | RESPIRATORY_TRACT | Status: AC
  Administered 2020-06-09: 8 via RESPIRATORY_TRACT
  Filled 2020-06-09: qty 6.7

## 2020-06-09 MED ORDER — SODIUM CHLORIDE 0.9 % IV SOLN
1.0000 g | Freq: Once | INTRAVENOUS | Status: AC
  Administered 2020-06-09: 1 g via INTRAVENOUS
  Filled 2020-06-09: qty 10

## 2020-06-09 MED ORDER — ENOXAPARIN SODIUM 80 MG/0.8ML ~~LOC~~ SOLN
65.0000 mg | SUBCUTANEOUS | Status: DC
  Administered 2020-06-09: 65 mg via SUBCUTANEOUS
  Filled 2020-06-09: qty 0.8

## 2020-06-09 MED ORDER — DOXYCYCLINE HYCLATE 100 MG PO TABS
100.0000 mg | ORAL_TABLET | Freq: Once | ORAL | Status: AC
  Administered 2020-06-09: 100 mg via ORAL
  Filled 2020-06-09: qty 1

## 2020-06-09 MED ORDER — DEXAMETHASONE SODIUM PHOSPHATE 10 MG/ML IJ SOLN
6.0000 mg | Freq: Two times a day (BID) | INTRAMUSCULAR | Status: DC
  Administered 2020-06-09: 6 mg via INTRAVENOUS
  Filled 2020-06-09: qty 1

## 2020-06-09 MED ORDER — ACETAMINOPHEN 500 MG PO TABS
1000.0000 mg | ORAL_TABLET | Freq: Once | ORAL | Status: AC
  Administered 2020-06-09: 1000 mg via ORAL
  Filled 2020-06-09: qty 2

## 2020-06-09 NOTE — ED Notes (Signed)
Pt took off oxygen, removed both ivs and came out of her room, asking for restroom.  Once returned from the bathroom, pt states her husband is coming to get her because she is leaving.  Encouraged pt that she should stay due to her oxygen levels being low but she remains adamant that she is leaving.  She agreed to speak to the physician.

## 2020-06-09 NOTE — ED Notes (Signed)
Called [facility] to advise pt will be discharged AMA

## 2020-06-09 NOTE — ED Notes (Signed)
Called [facility] to cancel pt transport. Spoke to [employer]

## 2020-06-09 NOTE — ED Provider Notes (Addendum)
[facility] EMERGENCY DEPARTMENT Provider Note   CSN: [id number] Arrival date & time: 06/09/20  1438     History Chief Complaint  Patient presents with  . Shortness of Breath    fever    Caitlin Houston is a 44 y.o. female.  44 year old female with history of lupus, a fib, smoker, brought in by husband for weakness and fever. Patient provides little to her history today, states she hasn't been feeling well for the past few days, began to run a temperature last night, woke up today and came to the ER. Staff was called by husband to assist patient out of the car. Initially, had a fever of 104 with O2 sat of 70% on room air (is not on supplemental oxygen). Patient was brought to the room, is found to be tachycardic, tachypneic. She reports having loose stools for a few days with cough, shortness of breath and fever.  Found to have a wound to her left lower leg which she states is healing.  Denies history of diabetes.  Patient did not get her Covid vaccine due to her history of lupus, was advised against vaccination. Patient is prescribed Plaquenil and Xarelto.  Caitlin Houston was evaluated in Emergency Department on 06/09/2020 for the symptoms described in the history of present illness. She was evaluated in the context of the global COVID-19 pandemic, which necessitated consideration that the patient might be at risk for infection with the SARS-CoV-2 virus that causes COVID-19. Institutional protocols and algorithms that pertain to the evaluation of patients at risk for COVID-19 are in a state of rapid change based on information released by regulatory bodies including the CDC and federal and state organizations. These policies and algorithms were followed during the patient's care in the ED.         Past Medical History:  Diagnosis Date  . Atrial fibrillation ([place])   . DDD (degenerative disc disease), lumbar   . DVT (deep venous thrombosis) ([place])   . Lupus ([place])   .  Pulmonary embolism ([place])   . TIA (transient ischemic attack)     Patient Active Problem List   Diagnosis Date Noted  . SOB (shortness of breath) 06/09/2020  . Community acquired pneumonia   . SIRS (systemic inflammatory response syndrome) ([facility])   . LGSIL on Pap smear of cervix on 08/22/17 08/27/2017  . Fibroids 08/22/2017  . Endometriosis 08/22/2017  . PCOS (polycystic ovarian syndrome) 08/22/2017  . Abnormal uterine bleeding (AUB) 08/22/2017    Past Surgical History:  Procedure Laterality Date  . GASTRIC BYPASS    . LAPAROSCOPY     Endometriosis diagnosis     OB History    Gravida  1   Para  1   Term  1   Preterm      AB      Living  1     SAB      TAB      Ectopic      Multiple      Live Births              No family history on file.  Social History   Tobacco Use  . Smoking status: Current Every Day Smoker    Types: Cigarettes  . Smokeless tobacco: Current User  Substance Use Topics  . Alcohol use: No  . Drug use: No    Home Medications Prior to Admission medications   Medication Sig Start Date End Date Taking? Authorizing Provider  aspirin 81 MG tablet Take 1 tablet (81 mg total) by mouth 3 (three) times daily. Patient taking differently: Take 81 mg by mouth daily after supper.  05/31/15   Montine Circle, PA-C  baclofen (LIORESAL) 20 MG tablet Take 20 mg by mouth 2 (two) times daily. 05/10/20   [provider]  butalbital-acetaminophen-caffeine (FIORICET, ESGIC) 50-325-40 MG per tablet Take 1 tablet by mouth every 6 (six) hours as needed for headache or migraine. 05/31/15   Montine Circle, PA-C  clotrimazole (LOTRIMIN) 1 % external solution Apply 1 application topically at bedtime. 05/31/15   Montine Circle, PA-C  cyclobenzaprine (FLEXERIL) 10 MG tablet Take 10 mg by mouth 3 (three) times daily as needed for muscle spasms.    [provider]  DULoxetine (CYMBALTA) 30 MG capsule Take 30 mg by mouth 2 (two) times daily.  06/04/20   [provider]  DULoxetine (CYMBALTA) 60 MG capsule Take 60 mg by mouth 2 (two) times daily. 05/14/20   [provider]  Fluticasone-Salmeterol (ADVAIR) 250-50 MCG/DOSE AEPB Inhale 1 puff into the lungs 2 (two) times daily.    [provider]  gabapentin (NEURONTIN) 800 MG tablet Take 800 mg by mouth 3 (three) times daily. 05/10/20   [provider]  HYDROcodone-acetaminophen (NORCO/VICODIN) 5-325 MG tablet Take 1-2 tablets every 6 hours as needed for severe pain 05/23/17   Carlisle Cater, PA-C  hydroxychloroquine (PLAQUENIL) 200 MG tablet Take 1 tablet (200 mg total) by mouth 3 (three) times daily. Patient taking differently: Take 200 mg by mouth 2 (two) times daily.  05/31/15   Montine Circle, PA-C  hydrOXYzine (ATARAX/VISTARIL) 25 MG tablet Take 1 tablet (25 mg total) by mouth every 6 (six) hours. 06/15/16   Junius Creamer, NP  loratadine (CLARITIN) 10 MG tablet Take 10 mg by mouth daily with lunch.    [provider]  LORazepam (ATIVAN) 0.5 MG tablet Take 0.5 mg by mouth daily as needed for anxiety. 06/08/20   [provider]  meclizine (ANTIVERT) 12.5 MG tablet Take 1 tablet (12.5 mg total) by mouth 2 (two) times daily. Patient taking differently: Take 12.5 mg by mouth daily.  05/31/15   Montine Circle, PA-C  metroNIDAZOLE (FLAGYL) 500 MG tablet Take 1 tablet (500 mg total) by mouth 2 (two) times daily. 08/24/17   Anyanwu, Sallyanne Havers, MD  naproxen (NAPROSYN) 500 MG tablet Take 1 tablet (500 mg total) by mouth 2 (two) times daily. 05/23/17   Carlisle Cater, PA-C  omeprazole (PRILOSEC) 20 MG capsule Take 1 capsule (20 mg total) by mouth daily. 11/05/17   Jola Schmidt, MD  ondansetron (ZOFRAN ODT) 8 MG disintegrating tablet Take 1 tablet (8 mg total) by mouth every 8 (eight) hours as needed for nausea or vomiting. 11/05/17   Jola Schmidt, MD  oxyCODONE (OXY IR/ROXICODONE) 5 MG immediate release tablet Take 5 mg by mouth 3 (three) times daily as  needed for pain. 06/04/20   [provider]  oxyCODONE-acetaminophen (PERCOCET/ROXICET) 5-325 MG tablet Take 2 tablets by mouth every 4 (four) hours as needed for severe pain. 11/21/15   Patel-Mills, Orvil Feil, PA-C  rivaroxaban (XARELTO) 20 MG TABS tablet Take 1 tablet (20 mg total) by mouth daily. Patient taking differently: Take 20 mg by mouth daily with supper.  05/31/15   Montine Circle, PA-C  SUMAtriptan (IMITREX) 50 MG tablet TK 1 T PO BID 07/26/17   [provider]  traMADol (ULTRAM) 50 MG tablet TK 1 T PO BID 07/29/17   [provider]  traZODone (DESYREL) 50 MG tablet Take 100 mg by mouth at bedtime. 05/10/20   [provider]    Allergies    Brassica oleracea, Coconut flavor, Latex, and Other  Review of Systems   Review of Systems  Unable to perform ROS: Acuity of condition  Constitutional: Positive for fever.  HENT: Positive for congestion.   Respiratory: Positive for cough and shortness of breath.   Cardiovascular: Negative for chest pain.  Gastrointestinal: Positive for diarrhea. Negative for abdominal pain, nausea and vomiting.  Genitourinary: Negative for dysuria and frequency.  Musculoskeletal: Positive for arthralgias and myalgias.  Skin: Positive for wound.  Allergic/Immunologic: Positive for immunocompromised state.  Neurological: Positive for weakness.    Physical Exam Updated Vital Signs BP (!) 117/93   Pulse (!) 105   Temp 99 F (37.2 C) (Oral)   Resp (!) 40   Ht _0  (1.651 m)   Wt (!) 136.5 kg   SpO2 (!) 77%   BMI 50.07 kg/m   Physical Exam Vitals and nursing note reviewed.  Constitutional:      General: She is not in acute distress.    Appearance: She is well-developed. She is obese. She is ill-appearing. She is not diaphoretic.  HENT:     Head: Normocephalic and atraumatic.  Eyes:     Pupils: Pupils are equal, round, and reactive to light.  Cardiovascular:     Rate and Rhythm: Regular rhythm. Tachycardia present.       Pulses: Normal pulses.  Pulmonary:     Effort: Pulmonary effort is normal.     Breath sounds: Normal breath sounds. No decreased breath sounds.  Chest:     Chest wall: No tenderness.  Abdominal:     Palpations: Abdomen is soft.     Tenderness: There is no abdominal tenderness.  Musculoskeletal:     Right lower leg: No edema.     Left lower leg: No edema.     Comments: Wound to left lower leg medially, no active drainage   Skin:    General: Skin is warm and dry.     Comments: Hot to the touch  Neurological:     General: No focal deficit present.     Mental Status: She is alert.     Comments: Generally weak, moves all extremities   Psychiatric:        Behavior: Behavior normal.     ED Results / Procedures / Treatments   Labs (all labs ordered are listed, but only abnormal results are displayed) Labs Reviewed  CBC WITH DIFFERENTIAL/PLATELET - Abnormal; Notable for the following components:      Result Value   WBC 14.2 (*)    RBC 3.66 (*)    Hemoglobin 11.3 (*)    HCT 34.5 (*)    Neutro Abs 12.3 (*)    Abs Immature Granulocytes 0.08 (*)    All other components within normal limits  COMPREHENSIVE METABOLIC PANEL - Abnormal; Notable for the following components:   Sodium 132 (*)    CO2 21 (*)    Glucose, Bld 124 (*)    Calcium 8.8 (*)    Total Protein 8.3 (*)    Albumin 3.1 (*)    AST 64 (*)    ALT 93 (*)    Alkaline Phosphatase 232 (*)    All other components within normal limits  LACTATE DEHYDROGENASE - Abnormal; Notable for the following components:   LDH 634 (*)    All other components within normal limits  C-REACTIVE PROTEIN - Abnormal; Notable for the following components:   CRP 33.8 (*)    All other components within normal limits  D-DIMER, QUANTITATIVE (NOT AT [facility]) - Abnormal; Notable for the following components:   D-Dimer, Quant 1.95 (*)    All other components within normal limits  FIBRINOGEN - Abnormal; Notable for the following components:    Fibrinogen >800 (*)    All other components within normal limits  SARS CORONAVIRUS 2 BY RT PCR (HOSPITAL ORDER, [place] [facility])  RESP PANEL BY RT PCR (RSV, FLU A&B, COVID)  CULTURE, BLOOD (ROUTINE X 2)  CULTURE, BLOOD (ROUTINE X 2)  LACTIC ACID, PLASMA  LACTIC ACID, PLASMA  FERRITIN  TRIGLYCERIDES  PROCALCITONIN  PREGNANCY, URINE  HIV ANTIBODY (ROUTINE TESTING W REFLEX)  LEGIONELLA PNEUMOPHILA SEROGP 1 UR AG  RSV(RESPIRATORY SYNCYTIAL VIRUS) AB, BLOOD    EKG EKG Interpretation  Date/Time:  Wednesday June 09 2020 14:57:03 EDT Ventricular Rate:  114 PR Interval:    QRS Duration: 110 QT Interval:  314 QTC Calculation: 433 R Axis:   109 Text Interpretation: Sinus tachycardia Low voltage, precordial leads Consider right ventricular hypertrophy Borderline T abnormalities, diffuse leads When comapred to prior, faster rate. No STEMI Confirmed by Antony Blackbird [phone]) on 06/09/2020 4:45:19 PM   Radiology DG Chest Portable 1 View  Result Date: 06/09/2020 CLINICAL DATA:  Fever, hypoxia EXAM: PORTABLE CHEST 1 VIEW COMPARISON:  05/17/2020 FINDINGS: Extensive slightly asymmetric airspace infiltrates have developed diffusely within the lungs, more severe within the right lung, in keeping with asymmetric pulmonary edema, possibly cardiogenic in nature, diffuse infection, or acute inflammation. No pneumothorax or pleural effusion. Mild to moderate cardiomegaly is present, slightly progressive since prior examination. No acute bone abnormality. IMPRESSION: 1. Extensive slightly asymmetric airspace infiltrates within the lungs, more severe within the right lung, in keeping with asymmetric pulmonary edema, possibly cardiogenic in nature, diffuse infection, or acute inflammation. 2. Mild to moderate cardiomegaly, slightly progressive since prior examination. Electronically Signed   By: Fidela Salisbury MD   On: 06/09/2020 15:38    Procedures .Critical Care Performed by:  Tacy Learn, PA-C Authorized by: Tacy Learn, PA-C   Critical care provider statement:    Critical care time (minutes):  45   Critical care was necessary to treat or prevent imminent or life-threatening deterioration of the following conditions:  Respiratory failure and sepsis   Critical care was time spent personally by me on the following activities:  Discussions with consultants, evaluation of patient's response to treatment, examination of patient, ordering and performing treatments and interventions, ordering and review of laboratory studies, ordering and review of radiographic studies, pulse oximetry, re-evaluation of patient's condition, obtaining history from patient or surrogate and review of old charts   (including critical care time)  Medications Ordered in ED Medications  enoxaparin (LOVENOX) injection 65 mg (65 mg Subcutaneous Given 06/09/20 1808)  dexamethasone (DECADRON) injection 6 mg (6 mg Intravenous Given 06/09/20 1808)  albuterol (VENTOLIN HFA) 108 (90 Base) MCG/ACT inhaler 8 puff (8 puffs Inhalation Given 06/09/20 1500)  acetaminophen (TYLENOL) tablet 1,000 mg (1,000 mg Oral Given 06/09/20 1500)  cefTRIAXone (ROCEPHIN) 1 g in sodium chloride 0.9 % 100 mL IVPB (0 g Intravenous Stopped 06/09/20 1923)  doxycycline (VIBRA-TABS) tablet 100 mg (100 mg Oral Given 06/09/20 1655)    ED Course  I have reviewed the triage vital signs and the nursing notes.  Pertinent labs & imaging results that were available during my care of  the patient were reviewed by me and considered in my medical decision making (see chart for details).  Clinical Course as of Jun 09 1941  Wed Jun 10, 3167  [id number] 44 year old female presents from home with generalized weakness, fever, shortness of breath.  Patient has a history of lupus and A. fib, is on Eliquis and Plaquenil.  Also daily smoker. On exam, patient appears unwell, fatigued, she answers yes and no to basic questions.  Patient is febrile,  hypoxic on room air on arrival with an O2 sat of 70%, not normally on oxygen at baseline, was placed on a nasal cannula with improvement in her O2 sats to 92% on 10 L.  Patient was tachycardic, tachypneic, febrile.  She is given Tylenol for her fever.  COVID-19 order set initiated, IV fluids and antibiotics withheld pending results.  Patient's Covid test has returned negative, chest x-ray with extensive slightly asymmetric airspace infiltrates within the lungs, more severe within the right lung, in keeping with asymmetric pulmonary edema, possible carcinogenic in nature, diffuse infection, or acute inflammation.  COVID-19 is still in differential for this patient however with a negative Covid test, will cover with antibiotics.  Patient also has a wound to her left lower leg which she states is healing, no active drainage from this wound. Review of labs, white count is elevated at 14.2 with increase in neutrophils.  D-dimer is elevated at 1.95.  Lactic acid reassuring at 1.1.  CMP with increased LFTs and alk phos.  Case discussed with Dr. Sherry Ruffing, ER attending, plan is to admit to hospitalist.   [LM]  1653 Discussed with Dr. Zigmund Daniel with [facility] hospitalist who accept patient in transfer to [facility].  Recommends Decadron 6 mg twice daily.   [LM]  1940 Patient is awake, alert, oriented, requesting to go home. Informed patient her decision to leave could lead to death or permanent disability today. Discussed her need for oxygen and antibiotics with monitoring. Patient again insists on leaving, accepting these risks, declines my offer to talk to a family member today.    [LM]    Clinical Course User Index [LM] Roque Lias   MDM Rules/Calculators/A&P                          Final Clinical Impression(s) / ED Diagnoses Final diagnoses:  Community acquired pneumonia, unspecified laterality  SIRS (systemic inflammatory response syndrome) [facility])    Rx / DC Orders ED Discharge Orders    None         Tacy Learn, PA-C 06/09/20 1810    Tacy Learn, PA-C 06/09/20 1942    Tegeler, Gwenyth Allegra, MD 06/09/20 2217

## 2020-06-09 NOTE — ED Triage Notes (Addendum)
C/o SOB and fever x 2 days, O2 sat 70 % in triage

## 2020-06-09 NOTE — ED Notes (Signed)
Late RT Note:  Patient SpO2 70s in lobby to room 4.  RR 35-40, BBS decreased fine crackles at bases, placed on 10 L/M HFNC. SpO2 increased >92%.  Given 8 puffs albuterol.

## 2020-06-09 NOTE — Progress Notes (Signed)
44 yo female with lupus immunosuppressed admitted with severe hypoxia fever shortness of breath sats 70% on room air chest x-ray with bilateral infiltrates.  Patient was given IV antibiotics in the ER.  I have also ordered Decadron twice a day 6 mg.  Patient coming in with diagnosis of acute hypoxic respiratory failure.  Covid PCR was negative.  Will need to repeat PCR in a.m. due to immunocompromised state.  I have added RSV respiratory virus panel Legionella.  Admitting her to stepdown as a PUI due to high suspicion for Covid.

## 2020-06-09 NOTE — ED Notes (Signed)
EDP at bedside, explainedto pt the risks of leaving to pt; pt still refused admission, saying "I am going home."

## 2020-06-11 LAB — RSV(RESPIRATORY SYNCYTIAL VIRUS) AB, BLOOD: RSV Ab: 1:8 {titer} — ABNORMAL HIGH

## 2020-06-14 LAB — CULTURE, BLOOD (ROUTINE X 2)
Culture: NO GROWTH
Culture: NO GROWTH
Special Requests: ADEQUATE

## 2020-07-22 ENCOUNTER — Other Ambulatory Visit: Payer: Self-pay

## 2020-07-22 ENCOUNTER — Emergency Department (HOSPITAL_BASED_OUTPATIENT_CLINIC_OR_DEPARTMENT_OTHER): Payer: Medicare HMO

## 2020-07-22 ENCOUNTER — Emergency Department (HOSPITAL_BASED_OUTPATIENT_CLINIC_OR_DEPARTMENT_OTHER)
Admission: EM | Admit: 2020-07-22 | Discharge: 2020-07-22 | Disposition: A | Discharge status: Home / Self Care | Payer: Medicare HMO | Attending: Emergency Medicine | Admitting: Emergency Medicine

## 2020-07-22 ENCOUNTER — Encounter (HOSPITAL_BASED_OUTPATIENT_CLINIC_OR_DEPARTMENT_OTHER): Payer: Self-pay | Admitting: *Deleted

## 2020-07-22 DIAGNOSIS — Z20822 Contact with and (suspected) exposure to covid-19: Secondary | ICD-10-CM | POA: Insufficient documentation

## 2020-07-22 DIAGNOSIS — R112 Nausea with vomiting, unspecified: Secondary | ICD-10-CM | POA: Insufficient documentation

## 2020-07-22 DIAGNOSIS — R05 Cough: Secondary | ICD-10-CM | POA: Diagnosis not present

## 2020-07-22 DIAGNOSIS — R6883 Chills (without fever): Secondary | ICD-10-CM | POA: Diagnosis not present

## 2020-07-22 DIAGNOSIS — Z5321 Procedure and treatment not carried out due to patient leaving prior to being seen by health care provider: Secondary | ICD-10-CM | POA: Diagnosis not present

## 2020-07-22 LAB — RESPIRATORY PANEL BY RT PCR (FLU A&B, COVID)
Influenza A by PCR: NEGATIVE
Influenza B by PCR: NEGATIVE
SARS Coronavirus 2 by RT PCR: NEGATIVE

## 2020-07-22 NOTE — ED Triage Notes (Signed)
C/o covid  Symptoms , n/v cough chills  X 2 days

## 2021-05-16 ENCOUNTER — Ambulatory Visit: Payer: Medicare HMO | Admitting: Obstetrics and Gynecology

## 2022-03-30 DEATH — deceased
